# Patient Record
Sex: Female | Born: 1985 | Race: White | Hispanic: No | Marital: Married | Smoking: Never smoker
Health system: Southern US, Community
[De-identification: ages and names within clinical notes are randomized; demographics above are authoritative.]

## PROBLEM LIST (undated history)

## (undated) DIAGNOSIS — K219 Gastro-esophageal reflux disease without esophagitis: Secondary | ICD-10-CM

## (undated) DIAGNOSIS — F909 Attention-deficit hyperactivity disorder, unspecified type: Secondary | ICD-10-CM

## (undated) DIAGNOSIS — K469 Unspecified abdominal hernia without obstruction or gangrene: Secondary | ICD-10-CM

## (undated) DIAGNOSIS — K449 Diaphragmatic hernia without obstruction or gangrene: Secondary | ICD-10-CM

## (undated) HISTORY — DX: Unspecified abdominal hernia without obstruction or gangrene: K46.9

## (undated) HISTORY — PX: ESOPHAGOGASTRODUODENOSCOPY (EGD) WITH PROPOFOL: SHX5813

## (undated) HISTORY — DX: Gastro-esophageal reflux disease without esophagitis: K21.9

---
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
## 2005-08-06 ENCOUNTER — Ambulatory Visit: Payer: Self-pay | Admitting: Internal Medicine

## 2005-08-07 ENCOUNTER — Ambulatory Visit (HOSPITAL_COMMUNITY): Admission: RE | Admit: 2005-08-07 | Discharge: 2005-08-07 | Payer: Self-pay | Admitting: Internal Medicine

## 2008-11-16 ENCOUNTER — Ambulatory Visit (HOSPITAL_COMMUNITY): Admission: RE | Admit: 2008-11-16 | Discharge: 2008-11-16 | Payer: Self-pay | Admitting: Pulmonary Disease

## 2010-10-20 NOTE — Op Note (Signed)
NAME:  Christina Golden, Christina Golden               ACCOUNT NO.:  [id number]   MEDICAL RECORD NO.:  [id number]          PATIENT TYPE:  AMB   LOCATION:  DAY                           FACILITY:  [facility]   PHYSICIAN:  R. Roetta Sessions, M.D. DATE OF BIRTH:  11-23-1985   DATE OF PROCEDURE:  09/15/2004  DATE OF DISCHARGE:                                 OPERATIVE REPORT   PROCEDURE:  Diagnostic esophagogastroduodenoscopy.   INDICATIONS FOR PROCEDURE:  The patient is a 25 year old lady with recent  chest pain and indigestion. Has been taking doxycycline for two months but  stopped taking it about four days ago because she could not remember to take  it. She was seen in the infirmary down at [facility] and was started on  Reglan and Nexium one week ago. Her father called and got her an appointment  reportedly to see [place] today; however, the office is closed. He called me at  the hospital, and after speaking with her, felt she should come to the  hospital and have EGD. This lady had no true odynophagia or dysphagia, and  her chest symptoms have subsided somewhat the past couple of days. Has had  any abdominal pain, nausea, vomiting, melena, or hematochezia. She has not  lost any weight. EGD is now being done to further evaluate her upper GI  tract symptoms. Pill-induced esophageal injury is high on the differential  list. This approach has been discussed with her at length. Please see  documentation in the medical record.   PROCEDURE NOTE:  O2 saturation, blood pressure, pulse, and respirations were  monitored throughout the entire procedure. Conscious sedation with Versed 7  mg IV and Demerol 125 mg IV in divided doses.   INSTRUMENT:  Olympus video chip system.   FINDINGS:  Examination of the tubular esophagus revealed a single linear  erosion, distal one third of the esophagus with intervening normal mucosa  between the distal aspect of the erosion of the EG junction. There are also  some areas of patchy  erythema involving esophageal mucosa. There was no  frank ulcer seen. Mucosa otherwise appeared normal. EG junction easily  traversed.   Stomach:  The gastric cavity was emptied and insufflated well with air.  Thorough examination of the gastric mucosa included retroflexed view of the  proximal stomach and esophagogastric junction demonstrated small hiatal  hernia only. Pylorus patent and easily traversed. Examination of the bulb  and second portion appeared normal.   The patient tolerated the procedure well and was reactive to endoscopy.   IMPRESSION:  Distal esophageal erosion and patchy erythema of the mucosa is  more consistent with pill-induced injury than reflux. Otherwise, normal  esophageal mucosa. Small hiatal hernia. Otherwise stomach. Normal D1 and D2.   RECOMMENDATIONS:  1.  Refrain from taking any more doxycycline.  2.  Continue Nexium 40 mg orally daily for the next month. Will go ahead and      stop Reglan.  3.  I suspect her symptoms will improve dramatically in the next week. If      they do not or if she  has any future problems, she is to let me know.      RMR/MEDQ  D:  09/15/2004  T:  09/15/2004  Job:  [id number]

## 2011-08-06 ENCOUNTER — Encounter (INDEPENDENT_AMBULATORY_CARE_PROVIDER_SITE_OTHER): Payer: Self-pay | Admitting: Internal Medicine

## 2011-08-06 ENCOUNTER — Ambulatory Visit (INDEPENDENT_AMBULATORY_CARE_PROVIDER_SITE_OTHER): Payer: PRIVATE HEALTH INSURANCE | Admitting: Internal Medicine

## 2011-08-06 VITALS — BP 118/70 | HR 78 | Temp 98.7°F | Resp 12 | Ht 66.0 in | Wt 174.8 lb

## 2011-08-06 DIAGNOSIS — R1013 Epigastric pain: Secondary | ICD-10-CM | POA: Insufficient documentation

## 2011-08-06 DIAGNOSIS — K219 Gastro-esophageal reflux disease without esophagitis: Secondary | ICD-10-CM

## 2011-08-06 MED ORDER — PANTOPRAZOLE SODIUM 40 MG PO TBEC: 40.0000 mg | DELAYED_RELEASE_TABLET | Freq: Every day | ORAL | Status: DC

## 2011-08-06 NOTE — Progress Notes (Signed)
Presenting complaint;  epigastric pain and intermittent heartburn. Subjective: Patient is a 26 year old Caucasian female is therefore scheduled visit for evaluation of epigastric pain and heartburn. She initially presented in March 2006 for chest pain and indigestion and underwent EGD by Dr. Jena Gauss [facility]). She had been on doxycycline. She had few esophageal erosions and focal erythema and a small sliding-type hernia. 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 She states she has done well with Nexium until was changed to Prilosec about 4 years ago because of cost reasons. She complains of intermittent epigastric pain of several months duration and she has heartburn at least 3-4 times a week despite taking medication and watching her diet. She describes her pain to be burning or aching which seems to get worse with coffee and eases with rest. She also has noted fullness or tightness along the anterior neck. She also has noted intermittent hoarseness, dry cough and dysphagia to solids occasionally. She has a good appetite. She has gained 10 pounds in the last 18 months. Her bowels move regularly. She denies melena or rectal bleeding. She takes Zantac OTC occasionally. She takes ibuprofen OTC 2-3 times a week for headache or musculoskeletal pain. H. pylori testing in the past was negative. She had negative upper abdominal ultrasound in March 2007.   Current Medications: Current Outpatient Prescriptions  Medication Sig Dispense Refill  . amphetamine-dextroamphetamine (ADDERALL, 10MG ,) 10 MG tablet Take 10 mg by mouth daily.      . fluticasone (FLONASE) 50 MCG/ACT nasal spray Place 2 sprays into the nose 2 (two) times daily.      Marland Kitchen ibuprofen (ADVIL,MOTRIN) 200 MG tablet Take 200 mg by mouth every 8 (eight) hours as needed.      . Multiple Vitamins-Minerals (MULTI COMPLETE PO) Take by mouth.      . norethindrone-ethinyl estradiol (JUNEL FE,GILDESS FE,LOESTRIN FE) 1-20  MG-MCG tablet Take 1 tablet by mouth daily.      Marland Kitchen omeprazole (PRILOSEC) 20 MG capsule Take 20 mg by mouth daily.       Past medical history; GERD as above. ADHD. She's had her wisdom teeth extracted. Allergies to sulfa resulting in rash. Family history; Both parents are in good father had sigmoid colon resection for complicated diverticulitis. She has a brother and a sister in good health. Social history. She is single. She does not smoke cigarettes and drinks alcohol socially but not every day or every week. Should be finishing college with a degree in pharmacy in May next year. Objective: Blood pressure 118/70, pulse 78, temperature 98.7 F (37.1 C), temperature source Oral, resp. rate 12, height 5\' 6"  (1.676 m), weight 174 lb 12.8 oz (79.289 kg), last menstrual period 07/15/2011. Conjunctiva is pink. Sclera is nonicteric Oropharyngeal mucosa is normal. No neck masses or thyromegaly noted. Cardiac exam with regular rhythm normal S1 and S2. No murmur or gallop noted. Lungs are clear to auscultation. Abdomen is symmetrical. Bowel sounds are normal. Palpation reveals soft abdomen with mild midepigastric tenderness but no organomegaly or masses. Rectal examination deferred.  No LE edema or clubbing noted.   Assessment: #1. Chronic GERD. Symptoms not well controlled with omeprazole or Prilosec. She had EGD in March 2006 as above.  #2. Intermittent epigastric pain. She possibly has chronic dyspepsia need to rule out gastritis or peptic ulcer disease.   Plan: Patient advised not to take ibuprofen. She can take Tylenol on when necessary basis for musculoskeletal pain not to exceed 2 g per  day. Hemoccults x1. H. pylori serology. Discontinue omeprazole. Start pantoprazole 40 mg by mouth every morning. If Hemoccult is positive would proceed with esophagogastroduodenoscopy. If she does not respond to pantoprazole will consider hepatobiliary ultrasound and EGD.

## 2011-08-06 NOTE — Patient Instructions (Addendum)
Can take acetaminophen on an as-needed basis no more than 2 g per day. Discontinue omeprazole and start pantoprazole 40 mg by mouth 30 minutes before breakfast daily. Hemoccults x1. H. pylori serology to be done.

## 2011-08-17 ENCOUNTER — Telehealth (INDEPENDENT_AMBULATORY_CARE_PROVIDER_SITE_OTHER): Payer: Self-pay | Admitting: *Deleted

## 2011-08-17 NOTE — Telephone Encounter (Signed)
Pantoprazole was to expensive . Patient tried the 14 days of Aciphex and feel that it is working well for her. Called to [facility] [place] Aciphex 20 mg Take 1 by mouth every morning #30 with 11 refills. Patient made aware.  Also note patient's Hemocult card - Neg for blood - patient was made aware.

## 2011-08-21 NOTE — Telephone Encounter (Signed)
Noted. Can continue AcipHex. Office visit in 3-4 months

## 2011-08-22 NOTE — Telephone Encounter (Signed)
Apt has been scheduled for 12/03/11 at 10:30 with Dr. Karilyn Cota

## 2011-08-22 NOTE — Telephone Encounter (Signed)
Forwarded to Lupita Leash to make an appointment for 3-4 months. Patient may already have one.

## 2011-09-21 ENCOUNTER — Encounter (INDEPENDENT_AMBULATORY_CARE_PROVIDER_SITE_OTHER): Payer: Self-pay

## 2011-12-03 ENCOUNTER — Ambulatory Visit (INDEPENDENT_AMBULATORY_CARE_PROVIDER_SITE_OTHER): Payer: PRIVATE HEALTH INSURANCE | Admitting: Internal Medicine

## 2015-08-12 ENCOUNTER — Emergency Department (HOSPITAL_COMMUNITY)
Admission: EM | Admit: 2015-08-12 | Discharge: 2015-08-12 | Disposition: A | Discharge status: Home / Self Care | Payer: PRIVATE HEALTH INSURANCE | Attending: Emergency Medicine | Admitting: Emergency Medicine

## 2015-08-12 ENCOUNTER — Encounter (HOSPITAL_COMMUNITY): Payer: Self-pay | Admitting: Emergency Medicine

## 2015-08-12 ENCOUNTER — Emergency Department (HOSPITAL_COMMUNITY): Payer: PRIVATE HEALTH INSURANCE

## 2015-08-12 DIAGNOSIS — Y929 Unspecified place or not applicable: Secondary | ICD-10-CM | POA: Diagnosis not present

## 2015-08-12 DIAGNOSIS — S39012A Strain of muscle, fascia and tendon of lower back, initial encounter: Secondary | ICD-10-CM | POA: Diagnosis not present

## 2015-08-12 DIAGNOSIS — S161XXA Strain of muscle, fascia and tendon at neck level, initial encounter: Secondary | ICD-10-CM | POA: Diagnosis not present

## 2015-08-12 DIAGNOSIS — S199XXA Unspecified injury of neck, initial encounter: Secondary | ICD-10-CM | POA: Diagnosis present

## 2015-08-12 DIAGNOSIS — Y939 Activity, unspecified: Secondary | ICD-10-CM | POA: Insufficient documentation

## 2015-08-12 DIAGNOSIS — Y999 Unspecified external cause status: Secondary | ICD-10-CM | POA: Insufficient documentation

## 2015-08-12 LAB — POC URINE PREG, ED: Preg Test, Ur: NEGATIVE

## 2015-08-12 MED ORDER — METHOCARBAMOL 500 MG PO TABS: 500.0000 mg | ORAL_TABLET | Freq: Four times a day (QID) | ORAL | Status: AC

## 2015-08-12 MED ORDER — IBUPROFEN 600 MG PO TABS: 600.0000 mg | ORAL_TABLET | Freq: Four times a day (QID) | ORAL | Status: DC | PRN

## 2015-08-12 MED ORDER — IBUPROFEN 800 MG PO TABS
800.0000 mg | ORAL_TABLET | Freq: Once | ORAL | Status: AC
  Administered 2015-08-12: 800 mg via ORAL
  Filled 2015-08-12: qty 1
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
## 2018-04-01 DIAGNOSIS — L821 Other seborrheic keratosis: Secondary | ICD-10-CM | POA: Diagnosis not present

## 2018-04-01 DIAGNOSIS — B078 Other viral warts: Secondary | ICD-10-CM | POA: Diagnosis not present

## 2018-04-01 DIAGNOSIS — L818 Other specified disorders of pigmentation: Secondary | ICD-10-CM | POA: Diagnosis not present

## 2018-04-01 DIAGNOSIS — Z1283 Encounter for screening for malignant neoplasm of skin: Secondary | ICD-10-CM | POA: Diagnosis not present

## 2018-04-01 DIAGNOSIS — D225 Melanocytic nevi of trunk: Secondary | ICD-10-CM | POA: Diagnosis not present

## 2018-05-13 DIAGNOSIS — H52223 Regular astigmatism, bilateral: Secondary | ICD-10-CM | POA: Diagnosis not present

## 2018-05-13 DIAGNOSIS — H5213 Myopia, bilateral: Secondary | ICD-10-CM | POA: Diagnosis not present

## 2018-07-21 DIAGNOSIS — Z6828 Body mass index (BMI) 28.0-28.9, adult: Secondary | ICD-10-CM | POA: Diagnosis not present

## 2018-07-21 DIAGNOSIS — Z01419 Encounter for gynecological examination (general) (routine) without abnormal findings: Secondary | ICD-10-CM | POA: Diagnosis not present

## 2018-11-29 DIAGNOSIS — Z20828 Contact with and (suspected) exposure to other viral communicable diseases: Secondary | ICD-10-CM | POA: Diagnosis not present

## 2018-12-19 DIAGNOSIS — Z3200 Encounter for pregnancy test, result unknown: Secondary | ICD-10-CM | POA: Diagnosis not present

## 2019-01-19 DIAGNOSIS — Z3201 Encounter for pregnancy test, result positive: Secondary | ICD-10-CM | POA: Diagnosis not present

## 2019-01-23 DIAGNOSIS — Z3401 Encounter for supervision of normal first pregnancy, first trimester: Secondary | ICD-10-CM | POA: Diagnosis not present

## 2019-01-23 DIAGNOSIS — Z3689 Encounter for other specified antenatal screening: Secondary | ICD-10-CM | POA: Diagnosis not present

## 2019-01-30 DIAGNOSIS — Z3401 Encounter for supervision of normal first pregnancy, first trimester: Secondary | ICD-10-CM | POA: Diagnosis not present

## 2019-01-30 DIAGNOSIS — Z3689 Encounter for other specified antenatal screening: Secondary | ICD-10-CM | POA: Diagnosis not present

## 2019-01-30 DIAGNOSIS — Z3400 Encounter for supervision of normal first pregnancy, unspecified trimester: Secondary | ICD-10-CM | POA: Diagnosis not present

## 2019-02-10 ENCOUNTER — Other Ambulatory Visit: Payer: Self-pay

## 2019-02-10 ENCOUNTER — Ambulatory Visit
Admission: EM | Admit: 2019-02-10 | Discharge: 2019-02-10 | Disposition: A | Discharge status: Home / Self Care | Payer: 59 | Attending: Emergency Medicine | Admitting: Emergency Medicine

## 2019-02-10 DIAGNOSIS — K0889 Other specified disorders of teeth and supporting structures: Secondary | ICD-10-CM

## 2019-02-10 DIAGNOSIS — K029 Dental caries, unspecified: Secondary | ICD-10-CM

## 2019-02-10 MED ORDER — AMOXICILLIN-POT CLAVULANATE 875-125 MG PO TABS: 1.0000 | ORAL_TABLET | Freq: Two times a day (BID) | ORAL | 0 refills | Status: AC

## 2019-02-10 NOTE — Discharge Instructions (Signed)
Augmentin prescribed.  Take as directed and to completion Take OTC tylenol as needed for pain Avoid chewing on left side Stick with soft diet Follow up with dentist for scheduled appt for further evaluation and treatment  Return or go to the ED if you have any new or worsening symptoms such as fever, chills, difficulty swallowing, painful swallowing, oral or neck swelling, nausea, vomiting, chest pain, SOB, etc..Marland Kitchen

## 2019-02-10 NOTE — ED Provider Notes (Signed)
[place]   DO:[id number] 02/10/19 Arrival Time: [id number]  CC: DENTAL PAIN  SUBJECTIVE:  Christina Golden is a 33 y.o. female hx significant for [redacted] weeks pregnant, who presents with left sided ear pain and left sided dental pain x couple of day.  Denies a precipitating event or trauma.  Localizes pain to left lower gums.  Has tried OTC analgesics without relief.  Worse with chewing.  Has dentist appt on 9/28.  Denies similar symptoms in the past.  Denies fever, chills, congestion, rhinorrhea, cough, dysphagia, odynophagia, oral or neck swelling, nausea, vomiting, chest pain, SOB.    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  All other pertinent ROS negative.     Past Medical History:  Diagnosis Date  . GERD (gastroesophageal reflux disease)   . Hernia of unspecified site of abdominal cavity without mention of obstruction or gangrene    Hiatal   Past Surgical History:  Procedure Laterality Date  . UPPER GASTROINTESTINAL ENDOSCOPY  2006   Dr. Gala Golden  . WISDOM TOOTH EXTRACTION  2004   Allergies  Allergen Reactions  . Sulfur Rash   No current facility-administered medications on file prior to encounter.    Current Outpatient Medications on File Prior to Encounter  Medication Sig Dispense Refill  . fluticasone (FLONASE) 50 MCG/ACT nasal spray Place 2 sprays into the nose 2 (two) times daily.    Marland Kitchen ibuprofen (ADVIL,MOTRIN) 600 MG tablet Take 1 tablet (600 mg total) by mouth every 6 (six) hours as needed. 30 tablet 0  . Multiple Vitamins-Minerals (MULTI COMPLETE PO) Take by mouth.    . pantoprazole (PROTONIX) 40 MG tablet Take 1 tablet (40 mg total) by mouth daily before breakfast. 30 tablet 5  . [DISCONTINUED] amphetamine-dextroamphetamine (ADDERALL, 10MG ,) 10 MG tablet Take 10 mg by mouth daily.    . [DISCONTINUED] norethindrone-ethinyl estradiol (JUNEL FE,GILDESS FE,LOESTRIN FE) 1-20 MG-MCG tablet Take 1 tablet by mouth daily.     Social History    Socioeconomic History  . Marital status: Single    Spouse name: Not on file  . Number of children: Not on file  . Years of education: Not on file  . Highest education level: Not on file  Occupational History  . Not on file  Social Needs  . Financial resource strain: Not on file  . Food insecurity    Worry: Not on file    Inability: Not on file  . Transportation needs    Medical: Not on file    Non-medical: Not on file  Tobacco Use  . Smoking status: Never Smoker  . Smokeless tobacco: Never Used  Substance and Sexual Activity  . Alcohol use: Yes    Comment: Social  . Drug use: No  . Sexual activity: Not on file  Lifestyle  . Physical activity    Days per week: Not on file    Minutes per session: Not on file  . Stress: Not on file  Relationships  . Social Herbalist on phone: Not on file    Gets together: Not on file    Attends religious service: Not on file    Active member of club or organization: Not on file    Attends meetings of clubs or organizations: Not on file    Relationship status: Not on file  . Intimate partner violence    Fear of current or ex partner: Not on file    Emotionally abused: Not on file  Physically abused: Not on file    Forced sexual activity: Not on file  Other Topics Concern  . Not on file  Social History Narrative  . Not on file   Family History  Problem Relation Age of Onset  . Hypertension Mother   . Hypertension Father   . Hyperlipidemia Father   . Healthy Sister   . Healthy Brother     OBJECTIVE:  Vitals:   02/10/19 1706  BP: 124/87  Pulse: 81  Resp: 18  Temp: 98.2 F (36.8 C)  SpO2: 98%    General appearance: alert; no distress HENT: normocephalic; atraumatic; EACs clear, TMs pearly gray; nares patent without rhinorrhea; oropharynx clear dentition: good; dental caries, second back molar over left lower gums without areas of fluctuance Neck: supple without LAD; mildly ttp over pre- and post- auricular  lymph node chains Lungs: normal respirations; CTAB CV: RRR Skin: warm and dry Psychological: alert and cooperative; normal mood and affect; pleasant   ASSESSMENT & PLAN:  1. Pain, dental   2. Dental caries     Meds ordered this encounter  Medications  . amoxicillin-clavulanate (AUGMENTIN) 875-125 MG tablet    Sig: Take 1 tablet by mouth every 12 (twelve) hours for 10 days.    Dispense:  20 tablet    Refill:  0    Order Specific Question:   Supervising Provider    Answer:   Christina Golden [id number]   Augmentin prescribed.  Take as directed and to completion Take OTC tylenol as needed for pain Avoid chewing on left side Stick with soft diet Follow up with dentist for scheduled appt for further evaluation and treatment  Return or go to the ED if you have any new or worsening symptoms such as fever, chills, difficulty swallowing, painful swallowing, oral or neck swelling, nausea, vomiting, chest pain, SOB, etc...  Reviewed expectations re: course of current medical issues. Questions answered. Outlined signs and symptoms indicating need for more acute intervention. Patient verbalized understanding. After Visit Summary given.   Lestine Box, PA-C 02/10/19 1737

## 2019-02-10 NOTE — ED Triage Notes (Signed)
Pt has c/o left ear pain for past few days

## 2019-02-19 DIAGNOSIS — Z3400 Encounter for supervision of normal first pregnancy, unspecified trimester: Secondary | ICD-10-CM | POA: Diagnosis not present

## 2019-02-19 DIAGNOSIS — O021 Missed abortion: Secondary | ICD-10-CM | POA: Diagnosis not present

## 2019-02-20 ENCOUNTER — Other Ambulatory Visit: Payer: Self-pay | Admitting: Obstetrics and Gynecology

## 2019-02-20 ENCOUNTER — Other Ambulatory Visit (HOSPITAL_COMMUNITY): Payer: Self-pay | Admitting: Obstetrics and Gynecology

## 2019-02-20 DIAGNOSIS — Z7689 Persons encountering health services in other specified circumstances: Secondary | ICD-10-CM

## 2019-02-23 ENCOUNTER — Other Ambulatory Visit (HOSPITAL_COMMUNITY)
Admission: RE | Admit: 2019-02-23 | Discharge: 2019-02-23 | Disposition: A | Discharge status: Home / Self Care | Payer: 59 | Source: Ambulatory Visit | Attending: Obstetrics and Gynecology | Admitting: Obstetrics and Gynecology

## 2019-02-23 ENCOUNTER — Other Ambulatory Visit: Payer: Self-pay | Admitting: Obstetrics and Gynecology

## 2019-02-23 DIAGNOSIS — Z01812 Encounter for preprocedural laboratory examination: Secondary | ICD-10-CM | POA: Insufficient documentation

## 2019-02-23 DIAGNOSIS — Z20828 Contact with and (suspected) exposure to other viral communicable diseases: Secondary | ICD-10-CM | POA: Insufficient documentation

## 2019-02-23 DIAGNOSIS — Z419 Encounter for procedure for purposes other than remedying health state, unspecified: Secondary | ICD-10-CM

## 2019-02-24 ENCOUNTER — Other Ambulatory Visit: Payer: Self-pay

## 2019-02-24 ENCOUNTER — Encounter (HOSPITAL_BASED_OUTPATIENT_CLINIC_OR_DEPARTMENT_OTHER): Payer: Self-pay | Admitting: *Deleted

## 2019-02-24 ENCOUNTER — Other Ambulatory Visit (HOSPITAL_COMMUNITY): Payer: Self-pay | Admitting: Obstetrics and Gynecology

## 2019-02-24 DIAGNOSIS — Z7689 Persons encountering health services in other specified circumstances: Secondary | ICD-10-CM

## 2019-02-24 NOTE — Progress Notes (Addendum)
Spoke w/ pt via phone for pre-op iinterview.  Npo after mn.  Arrive at [facility].  Needs cbc. Pt had covid done 02-23-2019.  Called and spoke w/ shanelle, or scheduler for dr Ronita Hipps, inquired about pt's ABO/Rh status, if they have one.  [place] stated yes and will fax result.  ADDENDUM:  Received ABO/ Rh result via fax, placed in chart. (B negative)

## 2019-02-25 LAB — NOVEL CORONAVIRUS, NAA (HOSP ORDER, SEND-OUT TO REF LAB; TAT 18-24 HRS): SARS-CoV-2, NAA: NOT DETECTED

## 2019-02-26 ENCOUNTER — Ambulatory Visit (HOSPITAL_BASED_OUTPATIENT_CLINIC_OR_DEPARTMENT_OTHER): Payer: 59 | Admitting: Anesthesiology

## 2019-02-26 ENCOUNTER — Ambulatory Visit (HOSPITAL_COMMUNITY)
Admission: RE | Admit: 2019-02-26 | Discharge: 2019-02-26 | Disposition: A | Discharge status: Home / Self Care | Payer: 59 | Source: Ambulatory Visit | Attending: Obstetrics and Gynecology | Admitting: Obstetrics and Gynecology

## 2019-02-26 ENCOUNTER — Encounter (HOSPITAL_BASED_OUTPATIENT_CLINIC_OR_DEPARTMENT_OTHER)
Admission: RE | Disposition: A | Discharge status: Home / Self Care | Payer: Self-pay | Source: Home / Self Care | Attending: Obstetrics and Gynecology

## 2019-02-26 ENCOUNTER — Encounter (HOSPITAL_BASED_OUTPATIENT_CLINIC_OR_DEPARTMENT_OTHER): Payer: Self-pay

## 2019-02-26 ENCOUNTER — Ambulatory Visit (HOSPITAL_BASED_OUTPATIENT_CLINIC_OR_DEPARTMENT_OTHER)
Admission: RE | Admit: 2019-02-26 | Discharge: 2019-02-26 | Disposition: A | Discharge status: Home / Self Care | Payer: 59 | Attending: Obstetrics and Gynecology | Admitting: Obstetrics and Gynecology

## 2019-02-26 DIAGNOSIS — O364XX0 Maternal care for intrauterine death, not applicable or unspecified: Secondary | ICD-10-CM | POA: Diagnosis not present

## 2019-02-26 DIAGNOSIS — K219 Gastro-esophageal reflux disease without esophagitis: Secondary | ICD-10-CM | POA: Diagnosis not present

## 2019-02-26 DIAGNOSIS — Z7689 Persons encountering health services in other specified circumstances: Secondary | ICD-10-CM

## 2019-02-26 DIAGNOSIS — F909 Attention-deficit hyperactivity disorder, unspecified type: Secondary | ICD-10-CM | POA: Diagnosis not present

## 2019-02-26 DIAGNOSIS — Z3A14 14 weeks gestation of pregnancy: Secondary | ICD-10-CM | POA: Insufficient documentation

## 2019-02-26 DIAGNOSIS — O99342 Other mental disorders complicating pregnancy, second trimester: Secondary | ICD-10-CM | POA: Insufficient documentation

## 2019-02-26 DIAGNOSIS — Z882 Allergy status to sulfonamides status: Secondary | ICD-10-CM | POA: Insufficient documentation

## 2019-02-26 DIAGNOSIS — O99612 Diseases of the digestive system complicating pregnancy, second trimester: Secondary | ICD-10-CM | POA: Diagnosis not present

## 2019-02-26 DIAGNOSIS — Z79899 Other long term (current) drug therapy: Secondary | ICD-10-CM | POA: Insufficient documentation

## 2019-02-26 DIAGNOSIS — O021 Missed abortion: Secondary | ICD-10-CM | POA: Diagnosis not present

## 2019-02-26 HISTORY — PX: OPERATIVE ULTRASOUND: SHX5996

## 2019-02-26 HISTORY — DX: Diaphragmatic hernia without obstruction or gangrene: K44.9

## 2019-02-26 HISTORY — PX: DILATION AND EVACUATION: SHX1459

## 2019-02-26 HISTORY — DX: Attention-deficit hyperactivity disorder, unspecified type: F90.9

## 2019-02-26 LAB — CBC
HCT: 43.9 % (ref 36.0–46.0)
Hemoglobin: 14 g/dL (ref 12.0–15.0)
MCH: 28.9 pg (ref 26.0–34.0)
MCHC: 31.9 g/dL (ref 30.0–36.0)
MCV: 90.5 fL (ref 80.0–100.0)
Platelets: 289 10*3/uL (ref 150–400)
RBC: 4.85 MIL/uL (ref 3.87–5.11)
RDW: 12 % (ref 11.5–15.5)
WBC: 7 10*3/uL (ref 4.0–10.5)
nRBC: 0 % (ref 0.0–0.2)

## 2019-02-26 SURGERY — DILATION AND EVACUATION, UTERUS
Anesthesia: General | Site: Vagina

## 2019-02-26 MED ORDER — LIDOCAINE 2% (20 MG/ML) 5 ML SYRINGE
INTRAMUSCULAR | Status: AC
  Filled 2019-02-26: qty 5

## 2019-02-26 MED ORDER — ONDANSETRON HCL 4 MG/2ML IJ SOLN
INTRAMUSCULAR | Status: DC | PRN
  Administered 2019-02-26: 4 mg via INTRAVENOUS

## 2019-02-26 MED ORDER — SUCCINYLCHOLINE CHLORIDE 200 MG/10ML IV SOSY
PREFILLED_SYRINGE | INTRAVENOUS | Status: DC | PRN
  Administered 2019-02-26: 20 mg via INTRAVENOUS
  Administered 2019-02-26: 80 mg via INTRAVENOUS

## 2019-02-26 MED ORDER — RHO D IMMUNE GLOBULIN 1500 UNIT/2ML IJ SOSY
300.0000 ug | PREFILLED_SYRINGE | Freq: Once | INTRAMUSCULAR | Status: AC
  Administered 2019-02-26: 300 ug via INTRAVENOUS
  Filled 2019-02-26: qty 2

## 2019-02-26 MED ORDER — LACTATED RINGERS IV SOLN
INTRAVENOUS | Status: DC
  Administered 2019-02-26: 08:00:00 via INTRAVENOUS
  Filled 2019-02-26: qty 1000

## 2019-02-26 MED ORDER — MIDAZOLAM HCL 2 MG/2ML IJ SOLN
INTRAMUSCULAR | Status: AC
  Filled 2019-02-26: qty 2

## 2019-02-26 MED ORDER — ACETAMINOPHEN 500 MG PO TABS
ORAL_TABLET | ORAL | Status: AC
  Filled 2019-02-26: qty 2

## 2019-02-26 MED ORDER — FENTANYL CITRATE (PF) 100 MCG/2ML IJ SOLN
INTRAMUSCULAR | Status: DC | PRN
  Administered 2019-02-26: 25 ug via INTRAVENOUS
  Administered 2019-02-26: 75 ug via INTRAVENOUS

## 2019-02-26 MED ORDER — SODIUM CHLORIDE 0.9 % IV SOLN
INTRAVENOUS | Status: DC
  Filled 2019-02-26: qty 1000

## 2019-02-26 MED ORDER — SUCCINYLCHOLINE CHLORIDE 200 MG/10ML IV SOSY
PREFILLED_SYRINGE | INTRAVENOUS | Status: AC
  Filled 2019-02-26: qty 10

## 2019-02-26 MED ORDER — KETOROLAC TROMETHAMINE 30 MG/ML IJ SOLN
INTRAMUSCULAR | Status: AC
  Filled 2019-02-26: qty 1

## 2019-02-26 MED ORDER — FENTANYL CITRATE (PF) 100 MCG/2ML IJ SOLN
INTRAMUSCULAR | Status: AC
  Filled 2019-02-26: qty 2

## 2019-02-26 MED ORDER — KETOROLAC TROMETHAMINE 30 MG/ML IJ SOLN
INTRAMUSCULAR | Status: DC | PRN
  Administered 2019-02-26: 30 mg via INTRAVENOUS

## 2019-02-26 MED ORDER — PROPOFOL 10 MG/ML IV BOLUS
INTRAVENOUS | Status: AC
  Filled 2019-02-26: qty 40

## 2019-02-26 MED ORDER — PROPOFOL 10 MG/ML IV BOLUS
INTRAVENOUS | Status: DC | PRN
  Administered 2019-02-26: 180 mg via INTRAVENOUS

## 2019-02-26 MED ORDER — ONDANSETRON HCL 4 MG/2ML IJ SOLN
INTRAMUSCULAR | Status: AC
  Filled 2019-02-26: qty 2

## 2019-02-26 MED ORDER — BUPIVACAINE HCL 0.25 % IJ SOLN
INTRAMUSCULAR | Status: DC | PRN
  Administered 2019-02-26: 13 mL

## 2019-02-26 MED ORDER — TRAMADOL HCL 50 MG PO TABS
50.0000 mg | ORAL_TABLET | Freq: Four times a day (QID) | ORAL | Status: DC | PRN
  Administered 2019-02-26: 50 mg via ORAL
  Filled 2019-02-26: qty 1

## 2019-02-26 MED ORDER — METHYLERGONOVINE MALEATE 0.2 MG/ML IJ SOLN
INTRAMUSCULAR | Status: AC
  Filled 2019-02-26: qty 1

## 2019-02-26 MED ORDER — DEXAMETHASONE SODIUM PHOSPHATE 10 MG/ML IJ SOLN
INTRAMUSCULAR | Status: DC | PRN
  Administered 2019-02-26: 10 mg via INTRAVENOUS

## 2019-02-26 MED ORDER — CEFAZOLIN SODIUM-DEXTROSE 2-4 GM/100ML-% IV SOLN
INTRAVENOUS | Status: AC
  Filled 2019-02-26: qty 100

## 2019-02-26 MED ORDER — FENTANYL CITRATE (PF) 100 MCG/2ML IJ SOLN
25.0000 ug | INTRAMUSCULAR | Status: DC | PRN
  Administered 2019-02-26: 25 ug via INTRAVENOUS
  Filled 2019-02-26: qty 1

## 2019-02-26 MED ORDER — ACETAMINOPHEN 500 MG PO TABS
1000.0000 mg | ORAL_TABLET | Freq: Once | ORAL | Status: AC
  Administered 2019-02-26: 1000 mg via ORAL
  Filled 2019-02-26: qty 2

## 2019-02-26 MED ORDER — MIDAZOLAM HCL 5 MG/5ML IJ SOLN
INTRAMUSCULAR | Status: DC | PRN
  Administered 2019-02-26: 2 mg via INTRAVENOUS

## 2019-02-26 MED ORDER — LIDOCAINE 2% (20 MG/ML) 5 ML SYRINGE
INTRAMUSCULAR | Status: DC | PRN
  Administered 2019-02-26: 100 mg via INTRAVENOUS

## 2019-02-26 MED ORDER — TRAMADOL HCL 50 MG PO TABS
ORAL_TABLET | ORAL | Status: AC
  Filled 2019-02-26: qty 1

## 2019-02-26 MED ORDER — DEXAMETHASONE SODIUM PHOSPHATE 10 MG/ML IJ SOLN
INTRAMUSCULAR | Status: AC
  Filled 2019-02-26: qty 1

## 2019-02-26 MED ORDER — CEFAZOLIN SODIUM-DEXTROSE 2-4 GM/100ML-% IV SOLN
2.0000 g | INTRAVENOUS | Status: AC
  Administered 2019-02-26: 2 g via INTRAVENOUS
  Filled 2019-02-26: qty 100

## 2019-02-26 MED ORDER — TRAMADOL HCL 50 MG PO TABS: 50.0000 mg | ORAL_TABLET | Freq: Four times a day (QID) | ORAL | 0 refills | Status: DC | PRN

## 2019-02-26 SURGICAL SUPPLY — 26 items
BAG URINE DRAINAGE (UROLOGICAL SUPPLIES) IMPLANT
CATH FOLEY 2WAY SLVR 30CC 16FR (CATHETERS) IMPLANT
CATH ROBINSON RED A/P 16FR (CATHETERS) ×1 IMPLANT
COVER WAND RF STERILE (DRAPES) ×3 IMPLANT
FILTER UTR ASPR ASSEMBLY (MISCELLANEOUS) ×3 IMPLANT
GLOVE BIO SURGEON STRL SZ7.5 (GLOVE) ×3 IMPLANT
GOWN STRL REUS W/TWL XL LVL3 (GOWN DISPOSABLE) ×3 IMPLANT
HOSE CONNECTING 18IN BERKELEY (TUBING) ×3 IMPLANT
KIT BERKELEY 1ST TRIMESTER 3/8 (MISCELLANEOUS) ×6 IMPLANT
KIT TURNOVER CYSTO (KITS) ×3 IMPLANT
NS IRRIG 500ML POUR BTL (IV SOLUTION) IMPLANT
PACK VAGINAL MINOR WOMEN LF (CUSTOM PROCEDURE TRAY) ×3 IMPLANT
PAD OB MATERNITY 4.3X12.25 (PERSONAL CARE ITEMS) ×1 IMPLANT
PAD PREP 24X48 CUFFED NSTRL (MISCELLANEOUS) ×3 IMPLANT
SCOPETTES 8  STERILE (MISCELLANEOUS)
SCOPETTES 8 STERILE (MISCELLANEOUS) IMPLANT
SET BERKELEY SUCTION TUBING (SUCTIONS) ×3 IMPLANT
TOWEL OR 17X26 10 PK STRL BLUE (TOWEL DISPOSABLE) ×3 IMPLANT
TRAP TISSUE FILTER (MISCELLANEOUS) ×6 IMPLANT
TUBE CONNECTING 12X1/4 (SUCTIONS) IMPLANT
VACURETTE 10 RIGID CVD (CANNULA) IMPLANT
VACURETTE 12 RIGID CVD (CANNULA) ×1 IMPLANT
VACURETTE 7MM CVD STRL WRAP (CANNULA) IMPLANT
VACURETTE 8 RIGID CVD (CANNULA) IMPLANT
VACURETTE 9 RIGID CVD (CANNULA) IMPLANT
WATER STERILE IRR 500ML POUR (IV SOLUTION) ×3 IMPLANT

## 2019-02-26 NOTE — Anesthesia Preprocedure Evaluation (Addendum)
Anesthesia Evaluation  Patient identified by MRN, date of birth, ID band Patient awake    Reviewed: Allergy & Precautions, NPO status , Patient's Chart, lab work & pertinent test results  Airway Mallampati: II  TM Distance: >3 FB Neck ROM: Full    Dental no notable dental hx.    Pulmonary neg pulmonary ROS,    Pulmonary exam normal breath sounds clear to auscultation       Cardiovascular negative cardio ROS Normal cardiovascular exam Rhythm:Regular Rate:Normal     Neuro/Psych negative neurological ROS  negative psych ROS   GI/Hepatic Neg liver ROS, hiatal hernia, GERD  ,  Endo/Other  negative endocrine ROS  Renal/GU negative Renal ROS  negative genitourinary   Musculoskeletal negative musculoskeletal ROS (+)   Abdominal   Peds  (+) ADHD Hematology negative hematology ROS (+)   Anesthesia Other Findings 13 week fetal demise  Reproductive/Obstetrics                            Anesthesia Physical Anesthesia Plan  ASA: II  Anesthesia Plan: General   Post-op Pain Management:    Induction: Intravenous and Rapid sequence  PONV Risk Score and Plan: 3 and Midazolam, Dexamethasone, Ondansetron and Scopolamine patch - Pre-op  Airway Management Planned: Oral ETT  Additional Equipment:   Intra-op Plan:   Post-operative Plan: Extubation in OR  Informed Consent: I have reviewed the patients History and Physical, chart, labs and discussed the procedure including the risks, benefits and alternatives for the proposed anesthesia with the patient or authorized representative who has indicated his/her understanding and acceptance.     Dental advisory given  Plan Discussed with: CRNA  Anesthesia Plan Comments:         Anesthesia Quick Evaluation

## 2019-02-26 NOTE — Op Note (Signed)
NAMEDAYTONA, Christina Golden MEDICAL RECORD [id number] ACCOUNT [id number] DATE OF BIRTH:02-25-1986 FACILITY: [facility] LOCATION: [facility] PHYSICIAN:Tamesha Ellerbrock J. Cassidy Tabet, MD  OPERATIVE REPORT  DATE OF PROCEDURE:  02/26/2019  PREOPERATIVE DIAGNOSIS:  Second trimester fetal loss at 14 weeks.  POSTOPERATIVE DIAGNOSIS:  Second trimester fetal loss at 14 weeks.  PROCEDURE:  Suction dilation and evacuation under ultrasound guidance.  SURGEON:  Brien Few, MD  ASSISTANT:  None.  ANESTHESIA:  General.  ESTIMATED BLOOD LOSS:  50 mL.  COMPLICATIONS:  None.  DRAINS:  None.  COUNTS:  Correct.  DISPOSITION:  The patient was taken to recovery in good condition.  BRIEF OPERATIVE NOTE:  After being apprised of the risks of anesthesia, infection, bleeding, and surrounding organs, possible need for repair, delayed versus immediate complications including bowel, bladder injury, possible need for repair.  The patient  was brought to the operating room.  She was administered general anesthetic without complications.  Prepped and draped in usual sterile fashion, catheterized until the bladder was empty.  Exam under anesthesia reveals a 12- to 14-week size anteflexed  uterus.  Ultrasound performed revealing as noted fetal demise with no fetal heart tones noted.  Cervix was then easily dilated up to a #37 Pratt dilator after placement of a dilute Marcaine solution 20 mL paracervical block in standard fashion.  At this  time, under ultrasound guidance, suction was performed, extracting the products of conception without difficulty.  Blunt curettage and repeat suction confirmed the cavity to be empty.  Good hemostasis was noted.  All instruments were removed.  The  patient tolerated the procedure well.  Tissue was sent for chromosomes and permanent pathology.  The patient was awakened and transferred to recovery in good condition.  LN/NUANCE  D:02/26/2019 T:02/26/2019 JOB:[id number]

## 2019-02-26 NOTE — Op Note (Signed)
02/26/2019  10:15 AM  PATIENT:  Christina Golden  33 y.o. female  PRE-OPERATIVE DIAGNOSIS:  13wk Fetal Demise  POST-OPERATIVE DIAGNOSIS:  13wk Fetal Demise  PROCEDURE:  Procedure(s): DILATATION AND EVACUATION OPERATIVE ULTRASOUND  SURGEON:  Surgeon(s): Brien Few, MD  ASSISTANTS: none   ANESTHESIA:   general  ESTIMATED BLOOD LOSS: 200 mL   DRAINS: none   LOCAL MEDICATIONS USED:  MARCAINE    and Amount: 20 ml  SPECIMEN:  Source of Specimen:  POC  DISPOSITION OF SPECIMEN:  PATHOLOGY  COUNTS:  YES  DICTATION #[id number]  PLAN OF CARE: dc home  PATIENT DISPOSITION:  PACU - hemodynamically stable.

## 2019-02-26 NOTE — H&P (Signed)
Christina Golden is an 33 y.o. female. MAB at 13 wks for D&E.  Pertinent Gynecological History: Menses: flow is moderate Bleeding: na Contraception: none DES exposure: denies Blood transfusions: none Sexually transmitted diseases: no past history Previous GYN Procedures: na  Last mammogram: na Date: na Last pap: normal Date: 2020 OB History: G1, P0   Menstrual History: Menarche age: 36 Patient's last menstrual period was 11/24/2018 (approximate).    Past Medical History:  Diagnosis Date  . ADHD   . GERD (gastroesophageal reflux disease)   . Hiatal hernia     Past Surgical History:  Procedure Laterality Date  . ESOPHAGOGASTRODUODENOSCOPY (EGD) WITH PROPOFOL  09-15-2004  dr Gala Romney  . WISDOM TOOTH EXTRACTION  2004    Family History  Problem Relation Age of Onset  . Hypertension Mother   . Hypertension Father   . Hyperlipidemia Father   . Healthy Sister   . Healthy Brother     Social History:  reports that she has never smoked. She has never used smokeless tobacco. She reports previous alcohol use. She reports that she does not use drugs.  Allergies:  Allergies  Allergen Reactions  . Sulfa Antibiotics Rash    Medications Prior to Admission  Medication Sig Dispense Refill Last Dose  . amphetamine-dextroamphetamine (ADDERALL) 30 MG tablet Take 30 mg by mouth daily.   02/24/2019  . famotidine (PEPCID) 20 MG tablet Take 20 mg by mouth daily.   02/24/2019  . Prenatal Vit-Fe Fumarate-FA (MULTIVITAMIN-PRENATAL) 27-0.8 MG TABS tablet Take 1 tablet by mouth daily at 12 noon.   02/18/2019    Review of Systems  Constitutional: Negative.   All other systems reviewed and are negative.   Blood pressure 131/79, pulse 86, temperature 97.9 F (36.6 C), temperature source Oral, resp. rate 16, height 5\' 8"  (1.727 m), weight 78.7 kg, last menstrual period 11/24/2018, SpO2 100 %, not currently breastfeeding. Physical Exam  Nursing note and vitals reviewed. Constitutional: She is  oriented to person, place, and time. She appears well-developed and well-nourished.  HENT:  Head: Normocephalic and atraumatic.  Neck: Normal range of motion. Neck supple.  Cardiovascular: Normal rate and regular rhythm.  Respiratory: Effort normal and breath sounds normal.  GI: Soft. Bowel sounds are normal.  Genitourinary:    Vagina and uterus normal.   Musculoskeletal: Normal range of motion.  Neurological: She is alert and oriented to person, place, and time. She has normal reflexes.  Skin: Skin is warm and dry.  Psychiatric: She has a normal mood and affect.    Results for orders placed or performed during the hospital encounter of 02/26/19 (from the past 24 hour(s))  CBC     Status: None   Collection Time: 02/26/19  8:23 AM  Result Value Ref Range   WBC 7.0 4.0 - 10.5 K/uL   RBC 4.85 3.87 - 5.11 MIL/uL   Hemoglobin 14.0 12.0 - 15.0 g/dL   HCT 43.9 36.0 - 46.0 %   MCV 90.5 80.0 - 100.0 fL   MCH 28.9 26.0 - 34.0 pg   MCHC 31.9 30.0 - 36.0 g/dL   RDW 12.0 11.5 - 15.5 %   Platelets 289 150 - 400 K/uL   nRBC 0.0 0.0 - 0.2 %    No results found.  Assessment/Plan: 13 week MAB Second trimester D&E  Sono guidance Tissue for chromosomes Consent done  Jehan Ranganathan J 02/26/2019, 9:35 AM

## 2019-02-26 NOTE — Discharge Instructions (Signed)
° °  Do not take any nonsteroidal anti inflammatories until after 4:00 pm today.   Post Anesthesia Home Care Instructions  Activity: Get plenty of rest for the remainder of the day. A responsible individual must stay with you for 24 hours following the procedure.  For the next 24 hours, DO NOT: -Drive a car -Paediatric nurse -Drink alcoholic beverages -Take any medication unless instructed by your physician -Make any legal decisions or sign important papers.  Meals: Start with liquid foods such as gelatin or soup. Progress to regular foods as tolerated. Avoid greasy, spicy, heavy foods. If nausea and/or vomiting occur, drink only clear liquids until the nausea and/or vomiting subsides. Call your physician if vomiting continues.  Special Instructions/Symptoms: Your throat may feel dry or sore from the anesthesia or the breathing tube placed in your throat during surgery. If this causes discomfort, gargle with warm salt water. The discomfort should disappear within 24 hours.  If you had a scopolamine patch placed behind your ear for the management of post- operative nausea and/or vomiting:  1. The medication in the patch is effective for 72 hours, after which it should be removed.  Wrap patch in a tissue and discard in the trash. Wash hands thoroughly with soap and water. 2. You may remove the patch earlier than 72 hours if you experience unpleasant side effects which may include dry mouth, dizziness or visual disturbances. 3. Avoid touching the patch. Wash your hands with soap and water after contact with the patch.

## 2019-02-26 NOTE — Anesthesia Postprocedure Evaluation (Signed)
Anesthesia Post Note  Patient: Christina Golden  Procedure(s) Performed: DILATATION AND EVACUATION (N/A Vagina ) OPERATIVE ULTRASOUND (N/A Abdomen)     Patient location during evaluation: PACU Anesthesia Type: General Level of consciousness: awake and alert Pain management: pain level controlled Vital Signs Assessment: post-procedure vital signs reviewed and stable Respiratory status: spontaneous breathing, nonlabored ventilation, respiratory function stable and patient connected to nasal cannula oxygen Cardiovascular status: blood pressure returned to baseline and stable Postop Assessment: no apparent nausea or vomiting Anesthetic complications: no    Last Vitals:  Vitals:   02/26/19 1142 02/26/19 1312  BP:  100/72  Pulse: 74 72  Resp: (!) 21   Temp:  36.8 C  SpO2: 100% 100%    Last Pain:  Vitals:   02/26/19 1312  TempSrc:   PainSc: 4                  Eun Vermeer L Amiel Mccaffrey

## 2019-02-26 NOTE — Transfer of Care (Signed)
Immediate Anesthesia Transfer of Care Note  Patient: Christina Golden Lem  Procedure(s) Performed: DILATATION AND EVACUATION (N/A ) OPERATIVE ULTRASOUND (N/A )  Patient Location: PACU  Anesthesia Type:General  Level of Consciousness: awake, alert  and oriented  Airway & Oxygen Therapy: Patient Spontanous Breathing and Patient connected to nasal cannula oxygen  Post-op Assessment: Report given to RN  Post vital signs: Reviewed and stable  Last Vitals:  Vitals Value Taken Time  BP 129/70 02/26/19 1026  Temp    Pulse 95 02/26/19 1028  Resp 16 02/26/19 1028  SpO2 95 % 02/26/19 1028  Vitals shown include unvalidated device data.  Last Pain:  Vitals:   02/26/19 0815  TempSrc: Oral  PainSc: 0-No pain      Patients Stated Pain Goal: 5 ([phone])  Complications: No apparent anesthesia complications

## 2019-02-26 NOTE — Progress Notes (Signed)
Patient seen and examined. Consent witnessed and signed. No changes noted. Update completed. BP 131/79   Pulse 86   Temp 97.9 F (36.6 C) (Oral)   Resp 16   Ht 5\' 8"  (1.727 m)   Wt 78.7 kg   LMP 11/24/2018 (Approximate)   SpO2 100%   Breastfeeding No   BMI 26.38 kg/m   CBC    Component Value Date/Time   WBC 7.0 02/26/2019 0823   RBC 4.85 02/26/2019 0823   HGB 14.0 02/26/2019 0823   HCT 43.9 02/26/2019 0823   PLT 289 02/26/2019 0823   MCV 90.5 02/26/2019 0823   MCH 28.9 02/26/2019 0823   MCHC 31.9 02/26/2019 0823   RDW 12.0 02/26/2019 0823

## 2019-02-26 NOTE — Anesthesia Procedure Notes (Signed)
Procedure Name: Intubation Date/Time: 02/26/2019 9:58 AM Performed by: Bonney Aid, CRNA Pre-anesthesia Checklist: Patient identified, Emergency Drugs available, Suction available and Patient being monitored Patient Re-evaluated:Patient Re-evaluated prior to induction Oxygen Delivery Method: Circle system utilized Preoxygenation: Pre-oxygenation with 100% oxygen Induction Type: IV induction Ventilation: Mask ventilation without difficulty Laryngoscope Size: Mac and 3 Grade View: Grade I Tube type: Oral Tube size: 7.0 mm Number of attempts: 1 Airway Equipment and Method: Stylet and Oral airway Placement Confirmation: ETT inserted through vocal cords under direct vision,  positive ETCO2 and breath sounds checked- equal and bilateral Secured at: 21 cm Tube secured with: Tape Dental Injury: Teeth and Oropharynx as per pre-operative assessment

## 2019-02-27 ENCOUNTER — Encounter (HOSPITAL_BASED_OUTPATIENT_CLINIC_OR_DEPARTMENT_OTHER): Payer: Self-pay | Admitting: Obstetrics and Gynecology

## 2019-02-27 LAB — RH IG WORKUP (INCLUDES ABO/RH)
ABO/RH(D): B NEG
Antibody Screen: NEGATIVE
Gestational Age(Wks): 13
Unit division: 0

## 2019-02-27 LAB — SURGICAL PATHOLOGY

## 2019-03-12 DIAGNOSIS — O039 Complete or unspecified spontaneous abortion without complication: Secondary | ICD-10-CM | POA: Diagnosis not present

## 2019-03-20 ENCOUNTER — Encounter (HOSPITAL_COMMUNITY): Payer: Self-pay | Admitting: Obstetrics and Gynecology

## 2019-03-26 LAB — MISC LABCORP TEST (SEND OUT)
LabCorp test name: 511880
Labcorp test code: 511880

## 2019-04-06 DIAGNOSIS — C039 Malignant neoplasm of gum, unspecified: Secondary | ICD-10-CM | POA: Diagnosis not present

## 2019-04-06 DIAGNOSIS — O039 Complete or unspecified spontaneous abortion without complication: Secondary | ICD-10-CM | POA: Diagnosis not present

## 2019-04-21 DIAGNOSIS — M9905 Segmental and somatic dysfunction of pelvic region: Secondary | ICD-10-CM | POA: Diagnosis not present

## 2019-04-21 DIAGNOSIS — M9903 Segmental and somatic dysfunction of lumbar region: Secondary | ICD-10-CM | POA: Diagnosis not present

## 2019-04-21 DIAGNOSIS — M9904 Segmental and somatic dysfunction of sacral region: Secondary | ICD-10-CM | POA: Diagnosis not present

## 2019-04-21 DIAGNOSIS — M9901 Segmental and somatic dysfunction of cervical region: Secondary | ICD-10-CM | POA: Diagnosis not present

## 2019-04-22 DIAGNOSIS — M9905 Segmental and somatic dysfunction of pelvic region: Secondary | ICD-10-CM | POA: Diagnosis not present

## 2019-04-22 DIAGNOSIS — M9904 Segmental and somatic dysfunction of sacral region: Secondary | ICD-10-CM | POA: Diagnosis not present

## 2019-04-22 DIAGNOSIS — M9903 Segmental and somatic dysfunction of lumbar region: Secondary | ICD-10-CM | POA: Diagnosis not present

## 2019-04-22 DIAGNOSIS — M9901 Segmental and somatic dysfunction of cervical region: Secondary | ICD-10-CM | POA: Diagnosis not present

## 2019-04-23 DIAGNOSIS — M9903 Segmental and somatic dysfunction of lumbar region: Secondary | ICD-10-CM | POA: Diagnosis not present

## 2019-04-23 DIAGNOSIS — M9901 Segmental and somatic dysfunction of cervical region: Secondary | ICD-10-CM | POA: Diagnosis not present

## 2019-04-23 DIAGNOSIS — M9904 Segmental and somatic dysfunction of sacral region: Secondary | ICD-10-CM | POA: Diagnosis not present

## 2019-04-23 DIAGNOSIS — M9905 Segmental and somatic dysfunction of pelvic region: Secondary | ICD-10-CM | POA: Diagnosis not present

## 2019-04-27 DIAGNOSIS — M9901 Segmental and somatic dysfunction of cervical region: Secondary | ICD-10-CM | POA: Diagnosis not present

## 2019-04-27 DIAGNOSIS — M9903 Segmental and somatic dysfunction of lumbar region: Secondary | ICD-10-CM | POA: Diagnosis not present

## 2019-04-27 DIAGNOSIS — M9904 Segmental and somatic dysfunction of sacral region: Secondary | ICD-10-CM | POA: Diagnosis not present

## 2019-04-27 DIAGNOSIS — M9905 Segmental and somatic dysfunction of pelvic region: Secondary | ICD-10-CM | POA: Diagnosis not present

## 2019-04-29 ENCOUNTER — Other Ambulatory Visit: Payer: Self-pay

## 2019-04-29 DIAGNOSIS — M9905 Segmental and somatic dysfunction of pelvic region: Secondary | ICD-10-CM | POA: Diagnosis not present

## 2019-04-29 DIAGNOSIS — M9903 Segmental and somatic dysfunction of lumbar region: Secondary | ICD-10-CM | POA: Diagnosis not present

## 2019-04-29 DIAGNOSIS — M9901 Segmental and somatic dysfunction of cervical region: Secondary | ICD-10-CM | POA: Diagnosis not present

## 2019-04-29 DIAGNOSIS — Z139 Encounter for screening, unspecified: Secondary | ICD-10-CM

## 2019-04-29 DIAGNOSIS — M9904 Segmental and somatic dysfunction of sacral region: Secondary | ICD-10-CM | POA: Diagnosis not present

## 2019-05-01 LAB — NOVEL CORONAVIRUS, NAA: SARS-CoV-2, NAA: NOT DETECTED

## 2019-05-04 DIAGNOSIS — M9904 Segmental and somatic dysfunction of sacral region: Secondary | ICD-10-CM | POA: Diagnosis not present

## 2019-05-04 DIAGNOSIS — M9901 Segmental and somatic dysfunction of cervical region: Secondary | ICD-10-CM | POA: Diagnosis not present

## 2019-05-04 DIAGNOSIS — M9903 Segmental and somatic dysfunction of lumbar region: Secondary | ICD-10-CM | POA: Diagnosis not present

## 2019-05-04 DIAGNOSIS — M9905 Segmental and somatic dysfunction of pelvic region: Secondary | ICD-10-CM | POA: Diagnosis not present

## 2019-05-06 DIAGNOSIS — M9903 Segmental and somatic dysfunction of lumbar region: Secondary | ICD-10-CM | POA: Diagnosis not present

## 2019-05-06 DIAGNOSIS — M9904 Segmental and somatic dysfunction of sacral region: Secondary | ICD-10-CM | POA: Diagnosis not present

## 2019-05-06 DIAGNOSIS — M9901 Segmental and somatic dysfunction of cervical region: Secondary | ICD-10-CM | POA: Diagnosis not present

## 2019-05-06 DIAGNOSIS — M9905 Segmental and somatic dysfunction of pelvic region: Secondary | ICD-10-CM | POA: Diagnosis not present

## 2019-05-11 DIAGNOSIS — M9903 Segmental and somatic dysfunction of lumbar region: Secondary | ICD-10-CM | POA: Diagnosis not present

## 2019-05-11 DIAGNOSIS — M9905 Segmental and somatic dysfunction of pelvic region: Secondary | ICD-10-CM | POA: Diagnosis not present

## 2019-05-11 DIAGNOSIS — M9901 Segmental and somatic dysfunction of cervical region: Secondary | ICD-10-CM | POA: Diagnosis not present

## 2019-05-11 DIAGNOSIS — M9904 Segmental and somatic dysfunction of sacral region: Secondary | ICD-10-CM | POA: Diagnosis not present

## 2019-05-13 DIAGNOSIS — M9901 Segmental and somatic dysfunction of cervical region: Secondary | ICD-10-CM | POA: Diagnosis not present

## 2019-05-13 DIAGNOSIS — M9904 Segmental and somatic dysfunction of sacral region: Secondary | ICD-10-CM | POA: Diagnosis not present

## 2019-05-13 DIAGNOSIS — M9903 Segmental and somatic dysfunction of lumbar region: Secondary | ICD-10-CM | POA: Diagnosis not present

## 2019-05-13 DIAGNOSIS — M9905 Segmental and somatic dysfunction of pelvic region: Secondary | ICD-10-CM | POA: Diagnosis not present

## 2019-05-18 DIAGNOSIS — M9901 Segmental and somatic dysfunction of cervical region: Secondary | ICD-10-CM | POA: Diagnosis not present

## 2019-05-18 DIAGNOSIS — M9904 Segmental and somatic dysfunction of sacral region: Secondary | ICD-10-CM | POA: Diagnosis not present

## 2019-05-18 DIAGNOSIS — M9905 Segmental and somatic dysfunction of pelvic region: Secondary | ICD-10-CM | POA: Diagnosis not present

## 2019-05-18 DIAGNOSIS — M9903 Segmental and somatic dysfunction of lumbar region: Secondary | ICD-10-CM | POA: Diagnosis not present

## 2019-05-25 DIAGNOSIS — M9903 Segmental and somatic dysfunction of lumbar region: Secondary | ICD-10-CM | POA: Diagnosis not present

## 2019-05-25 DIAGNOSIS — M9901 Segmental and somatic dysfunction of cervical region: Secondary | ICD-10-CM | POA: Diagnosis not present

## 2019-05-25 DIAGNOSIS — M9905 Segmental and somatic dysfunction of pelvic region: Secondary | ICD-10-CM | POA: Diagnosis not present

## 2019-05-25 DIAGNOSIS — M9904 Segmental and somatic dysfunction of sacral region: Secondary | ICD-10-CM | POA: Diagnosis not present

## 2019-05-27 DIAGNOSIS — M9903 Segmental and somatic dysfunction of lumbar region: Secondary | ICD-10-CM | POA: Diagnosis not present

## 2019-05-27 DIAGNOSIS — M9901 Segmental and somatic dysfunction of cervical region: Secondary | ICD-10-CM | POA: Diagnosis not present

## 2019-05-27 DIAGNOSIS — M9905 Segmental and somatic dysfunction of pelvic region: Secondary | ICD-10-CM | POA: Diagnosis not present

## 2019-05-27 DIAGNOSIS — M9904 Segmental and somatic dysfunction of sacral region: Secondary | ICD-10-CM | POA: Diagnosis not present

## 2019-06-01 DIAGNOSIS — M9901 Segmental and somatic dysfunction of cervical region: Secondary | ICD-10-CM | POA: Diagnosis not present

## 2019-06-01 DIAGNOSIS — M9903 Segmental and somatic dysfunction of lumbar region: Secondary | ICD-10-CM | POA: Diagnosis not present

## 2019-06-01 DIAGNOSIS — M9905 Segmental and somatic dysfunction of pelvic region: Secondary | ICD-10-CM | POA: Diagnosis not present

## 2019-06-01 DIAGNOSIS — M9904 Segmental and somatic dysfunction of sacral region: Secondary | ICD-10-CM | POA: Diagnosis not present

## 2019-06-03 DIAGNOSIS — M9903 Segmental and somatic dysfunction of lumbar region: Secondary | ICD-10-CM | POA: Diagnosis not present

## 2019-06-03 DIAGNOSIS — M9905 Segmental and somatic dysfunction of pelvic region: Secondary | ICD-10-CM | POA: Diagnosis not present

## 2019-06-03 DIAGNOSIS — M9904 Segmental and somatic dysfunction of sacral region: Secondary | ICD-10-CM | POA: Diagnosis not present

## 2019-06-03 DIAGNOSIS — M9901 Segmental and somatic dysfunction of cervical region: Secondary | ICD-10-CM | POA: Diagnosis not present

## 2019-06-09 LAB — OB RESULTS CONSOLE GBS: GBS: NEGATIVE

## 2019-09-30 DIAGNOSIS — Z6832 Body mass index (BMI) 32.0-32.9, adult: Secondary | ICD-10-CM | POA: Diagnosis not present

## 2019-09-30 DIAGNOSIS — Z01419 Encounter for gynecological examination (general) (routine) without abnormal findings: Secondary | ICD-10-CM | POA: Diagnosis not present

## 2019-09-30 DIAGNOSIS — O039 Complete or unspecified spontaneous abortion without complication: Secondary | ICD-10-CM | POA: Diagnosis not present

## 2019-09-30 DIAGNOSIS — Z1151 Encounter for screening for human papillomavirus (HPV): Secondary | ICD-10-CM | POA: Diagnosis not present

## 2019-10-30 DIAGNOSIS — Z3A08 8 weeks gestation of pregnancy: Secondary | ICD-10-CM | POA: Diagnosis not present

## 2019-10-30 DIAGNOSIS — O09291 Supervision of pregnancy with other poor reproductive or obstetric history, first trimester: Secondary | ICD-10-CM | POA: Diagnosis not present

## 2019-10-30 DIAGNOSIS — Z3689 Encounter for other specified antenatal screening: Secondary | ICD-10-CM | POA: Diagnosis not present

## 2019-10-30 DIAGNOSIS — Z3200 Encounter for pregnancy test, result unknown: Secondary | ICD-10-CM | POA: Diagnosis not present

## 2019-11-03 DIAGNOSIS — O09291 Supervision of pregnancy with other poor reproductive or obstetric history, first trimester: Secondary | ICD-10-CM | POA: Diagnosis not present

## 2019-11-03 DIAGNOSIS — Z3A08 8 weeks gestation of pregnancy: Secondary | ICD-10-CM | POA: Diagnosis not present

## 2019-11-05 DIAGNOSIS — Z3A08 8 weeks gestation of pregnancy: Secondary | ICD-10-CM | POA: Diagnosis not present

## 2019-11-05 DIAGNOSIS — O09291 Supervision of pregnancy with other poor reproductive or obstetric history, first trimester: Secondary | ICD-10-CM | POA: Diagnosis not present

## 2019-11-11 DIAGNOSIS — O09291 Supervision of pregnancy with other poor reproductive or obstetric history, first trimester: Secondary | ICD-10-CM | POA: Diagnosis not present

## 2019-11-11 DIAGNOSIS — Z3A01 Less than 8 weeks gestation of pregnancy: Secondary | ICD-10-CM | POA: Diagnosis not present

## 2019-11-19 DIAGNOSIS — Z6828 Body mass index (BMI) 28.0-28.9, adult: Secondary | ICD-10-CM | POA: Diagnosis not present

## 2019-11-19 DIAGNOSIS — E559 Vitamin D deficiency, unspecified: Secondary | ICD-10-CM | POA: Diagnosis not present

## 2019-11-19 DIAGNOSIS — E538 Deficiency of other specified B group vitamins: Secondary | ICD-10-CM | POA: Diagnosis not present

## 2019-11-26 DIAGNOSIS — Z3201 Encounter for pregnancy test, result positive: Secondary | ICD-10-CM | POA: Diagnosis not present

## 2019-12-17 DIAGNOSIS — O09291 Supervision of pregnancy with other poor reproductive or obstetric history, first trimester: Secondary | ICD-10-CM | POA: Diagnosis not present

## 2019-12-17 DIAGNOSIS — Z3689 Encounter for other specified antenatal screening: Secondary | ICD-10-CM | POA: Diagnosis not present

## 2019-12-17 LAB — OB RESULTS CONSOLE HEPATITIS B SURFACE ANTIGEN: Hepatitis B Surface Ag: NEGATIVE

## 2019-12-17 LAB — OB RESULTS CONSOLE ABO/RH: RH Type: NEGATIVE

## 2019-12-17 LAB — OB RESULTS CONSOLE HIV ANTIBODY (ROUTINE TESTING): HIV: NONREACTIVE

## 2019-12-17 LAB — OB RESULTS CONSOLE RPR: RPR: NONREACTIVE

## 2019-12-17 LAB — OB RESULTS CONSOLE GC/CHLAMYDIA
Chlamydia: NEGATIVE
Gonorrhea: NEGATIVE

## 2019-12-17 LAB — OB RESULTS CONSOLE RUBELLA ANTIBODY, IGM: Rubella: IMMUNE

## 2020-01-07 DIAGNOSIS — Z3A13 13 weeks gestation of pregnancy: Secondary | ICD-10-CM | POA: Diagnosis not present

## 2020-01-07 DIAGNOSIS — O09291 Supervision of pregnancy with other poor reproductive or obstetric history, first trimester: Secondary | ICD-10-CM | POA: Diagnosis not present

## 2020-01-11 ENCOUNTER — Other Ambulatory Visit: Payer: Self-pay

## 2020-01-11 ENCOUNTER — Ambulatory Visit
Admission: EM | Admit: 2020-01-11 | Discharge: 2020-01-11 | Disposition: A | Discharge status: Home / Self Care | Payer: 59 | Attending: Emergency Medicine | Admitting: Emergency Medicine

## 2020-01-11 DIAGNOSIS — Z1152 Encounter for screening for COVID-19: Secondary | ICD-10-CM | POA: Diagnosis not present

## 2020-01-11 DIAGNOSIS — J069 Acute upper respiratory infection, unspecified: Secondary | ICD-10-CM

## 2020-01-11 NOTE — Discharge Instructions (Addendum)
COVID testing ordered.  It will take between 2-7 days for test results.  Someone will contact you regarding abnormal results.    In the meantime: You should remain isolated in your home for 10 days from symptom onset AND greater than 24 hours after symptoms resolution (absence of fever without the use of fever-reducing medication and improvement in respiratory symptoms), whichever is longer Get plenty of rest and push fluids Safe medication list during pregnancy was provided Use medications daily for symptom relief Use OTC medications like ibuprofen or tylenol as needed fever or pain Call or go to the ED if you have any new or worsening symptoms such as fever, worsening cough, shortness of breath, chest tightness, chest pain, turning blue, changes in mental status, etc..

## 2020-01-11 NOTE — ED Triage Notes (Signed)
Pt presents with c/o nasal congestion for past couple days

## 2020-01-11 NOTE — ED Provider Notes (Signed)
[place]   [id number] 01/11/20 Arrival Time: 7425   Chief Complaint  Patient presents with   Nasal Congestion     SUBJECTIVE: History from: patient.  Christina Golden is a 34 y.o. female who is pregnant presented to the urgent care with a complaint of nasal congestion, cough and postnasal drip for the past couple days.  Denies sick exposure to COVID, flu or strep.  Denies recent travel.  Has tried OTC medication without relief.  Denies alleviating or aggravating factors.  Denies previous symptoms in the past.   Denies fever, chills, fatigue, sinus pain, rhinorrhea, sore throat, SOB, wheezing, chest pain, nausea, changes in bowel or bladder habits.     ROS: As per HPI.  All other pertinent ROS negative.      Past Medical History:  Diagnosis Date   ADHD    GERD (gastroesophageal reflux disease)    Hiatal hernia    Past Surgical History:  Procedure Laterality Date   DILATION AND EVACUATION N/A 02/26/2019   Procedure: DILATATION AND EVACUATION;  Surgeon: Brien Few, MD;  Location: [place];  Service: Gynecology;  Laterality: N/A;   ESOPHAGOGASTRODUODENOSCOPY (EGD) WITH PROPOFOL  09-15-2004  dr Gala Romney   OPERATIVE ULTRASOUND N/A 02/26/2019   Procedure: OPERATIVE ULTRASOUND;  Surgeon: Brien Few, MD;  Location: [place];  Service: Gynecology;  Laterality: N/A;   WISDOM TOOTH EXTRACTION  2004   Allergies  Allergen Reactions   Sulfa Antibiotics Rash   No current facility-administered medications on file prior to encounter.   Current Outpatient Medications on File Prior to Encounter  Medication Sig Dispense Refill   amphetamine-dextroamphetamine (ADDERALL) 30 MG tablet Take 30 mg by mouth daily.     famotidine (PEPCID) 20 MG tablet Take 20 mg by mouth daily.     Prenatal Vit-Fe Fumarate-FA (MULTIVITAMIN-PRENATAL) 27-0.8 MG TABS tablet Take 1 tablet by mouth daily at 12 noon.     traMADol (ULTRAM) 50 MG tablet Take  1-2 tablets (50-100 mg total) by mouth every 6 (six) hours as needed. 20 tablet 0   [DISCONTINUED] norethindrone-ethinyl estradiol (JUNEL FE,GILDESS FE,LOESTRIN FE) 1-20 MG-MCG tablet Take 1 tablet by mouth daily.     Social History   Socioeconomic History   Marital status: Married    Spouse name: Not on file   Number of children: Not on file   Years of education: Not on file   Highest education level: Not on file  Occupational History   Not on file  Tobacco Use   Smoking status: Never Smoker   Smokeless tobacco: Never Used  Vaping Use   Vaping Use: Never used  Substance and Sexual Activity   Alcohol use: Not Currently    Comment: Social   Drug use: No   Sexual activity: Yes    Birth control/protection: None  Other Topics Concern   Not on file  Social History Narrative   Not on file   Social Determinants of Health   Financial Resource Strain:    Difficulty of Paying Living Expenses:   Food Insecurity:    Worried About Charity fundraiser in the Last Year:    Arboriculturist in the Last Year:   Transportation Needs:    Film/video editor (Medical):    Lack of Transportation (Non-Medical):   Physical Activity:    Days of Exercise per Week:    Minutes of Exercise per Session:   Stress:    Feeling of Stress :  Social Connections:    Frequency of Communication with Friends and Family:    Frequency of Social Gatherings with Friends and Family:    Attends Religious Services:    Active Member of Clubs or Organizations:    Attends Music therapist:    Marital Status:   Intimate Partner Violence:    Fear of Current or Ex-Partner:    Emotionally Abused:    Physically Abused:    Sexually Abused:    Family History  Problem Relation Age of Onset   Hypertension Mother    Hypertension Father    Hyperlipidemia Father    Healthy Sister    Healthy Brother     OBJECTIVE:  Vitals:   01/11/20 1306  BP: 121/81    Pulse: 90  Resp: 16  Temp: 98 F (36.7 C)  TempSrc: Oral  SpO2: 98%     General appearance: alert; appears fatigued, but nontoxic; speaking in full sentences and tolerating own secretions HEENT: NCAT; Ears: EACs clear, TMs pearly gray; Eyes: PERRL.  EOM grossly intact. Sinuses: nontender; Nose: nares patent without rhinorrhea, Throat: oropharynx clear, tonsils non erythematous or enlarged, uvula midline  Neck: supple without LAD Lungs: unlabored respirations, symmetrical air entry; cough: mild; no respiratory distress; CTAB Heart: regular rate and rhythm.  Radial pulses 2+ symmetrical bilaterally Skin: warm and dry Psychological: alert and cooperative; normal mood and affect  LABS:  No results found for this or any previous visit (from the past 24 hour(s)).   ASSESSMENT & PLAN:  1. URI with cough and congestion   2. Encounter for screening for COVID-19     No orders of the defined types were placed in this encounter.  Discharge Instructions    COVID testing ordered.  It will take between 2-7 days for test results.  Someone will contact you regarding abnormal results.    In the meantime: You should remain isolated in your home for 10 days from symptom onset AND greater than 24 hours after symptoms resolution (absence of fever without the use of fever-reducing medication and improvement in respiratory symptoms), whichever is longer Get plenty of rest and push fluids Safe medication list during pregnancy was provided Use medications daily for symptom relief Use OTC medications like ibuprofen or tylenol as needed fever or pain Call or go to the ED if you have any new or worsening symptoms such as fever, worsening cough, shortness of breath, chest tightness, chest pain, turning blue, changes in mental status, etc...   Reviewed expectations re: course of current medical issues. Questions answered. Outlined signs and symptoms indicating need for more acute intervention. Patient  verbalized understanding. After Visit Summary given.      Note: This document was prepared using Dragon voice recognition software and may include unintentional dictation errors.    Emerson Monte, Manuel Garcia 01/11/20 1334

## 2020-01-12 LAB — SARS-COV-2, NAA 2 DAY TAT

## 2020-01-12 LAB — NOVEL CORONAVIRUS, NAA: SARS-CoV-2, NAA: NOT DETECTED

## 2020-01-15 ENCOUNTER — Other Ambulatory Visit: Payer: Self-pay | Admitting: Family

## 2020-01-15 MED ORDER — AZITHROMYCIN 250 MG PO TABS: ORAL_TABLET | ORAL | 0 refills | Status: DC

## 2020-01-15 NOTE — Progress Notes (Signed)
PT complaining of congestion, fever, and cough for greater than a week. I will send in zpak, but will only start if symptoms worsen.

## 2020-01-28 DIAGNOSIS — Z361 Encounter for antenatal screening for raised alphafetoprotein level: Secondary | ICD-10-CM | POA: Diagnosis not present

## 2020-02-18 DIAGNOSIS — Z363 Encounter for antenatal screening for malformations: Secondary | ICD-10-CM | POA: Diagnosis not present

## 2020-03-02 DIAGNOSIS — Z362 Encounter for other antenatal screening follow-up: Secondary | ICD-10-CM | POA: Diagnosis not present

## 2020-04-13 DIAGNOSIS — Z3689 Encounter for other specified antenatal screening: Secondary | ICD-10-CM | POA: Diagnosis not present

## 2020-04-25 DIAGNOSIS — Z3A29 29 weeks gestation of pregnancy: Secondary | ICD-10-CM | POA: Diagnosis not present

## 2020-04-25 DIAGNOSIS — O09293 Supervision of pregnancy with other poor reproductive or obstetric history, third trimester: Secondary | ICD-10-CM | POA: Diagnosis not present

## 2020-04-25 DIAGNOSIS — O360130 Maternal care for anti-D [Rh] antibodies, third trimester, not applicable or unspecified: Secondary | ICD-10-CM | POA: Diagnosis not present

## 2020-05-09 DIAGNOSIS — Z3480 Encounter for supervision of other normal pregnancy, unspecified trimester: Secondary | ICD-10-CM | POA: Diagnosis not present

## 2020-05-09 DIAGNOSIS — Z23 Encounter for immunization: Secondary | ICD-10-CM | POA: Diagnosis not present

## 2020-06-04 NOTE — L&D Delivery Note (Signed)
Operative Delivery Note At 10:15 PM a viable and healthy female was delivered via Vaginal, Vacuum Neurosurgeon).  Presentation: vertex; Position: Occiput,, Anterior; Station: +3.  Verbal consent: obtained from patient. Maternal exhaustion and poor maternal pushing efforts x 3 hrs. Risks and benefits discussed in detail.  Risks include, but are not limited to the risks of anesthesia, bleeding, infection, damage to maternal tissues, fetal cephalhematoma.  There is also the risk of inability to effect vaginal delivery of the head, or shoulder dystocia that cannot be resolved by established maneuvers, leading to the need for emergency cesarean section.  APGAR: 7, 9; weight  .   Placenta status:Manual  intact, .   Cord:  with the following complications: .na  Cord pH: na  Anesthesia:  epidural Instruments: kiwi x 3 pulls Episiotomy: None Lacerations:   Suture Repair: 2.0 vicryl rapide Est. Blood Loss (mL):  300  Mom to postpartum.  Baby to Couplet care / Skin to Skin.  Yailen Zemaitis J 07/06/2020, 10:52 PM

## 2020-06-08 DIAGNOSIS — Z3A35 35 weeks gestation of pregnancy: Secondary | ICD-10-CM | POA: Diagnosis not present

## 2020-06-08 DIAGNOSIS — Z8759 Personal history of other complications of pregnancy, childbirth and the puerperium: Secondary | ICD-10-CM | POA: Diagnosis not present

## 2020-06-08 DIAGNOSIS — Z3685 Encounter for antenatal screening for Streptococcus B: Secondary | ICD-10-CM | POA: Diagnosis not present

## 2020-06-08 DIAGNOSIS — O139 Gestational [pregnancy-induced] hypertension without significant proteinuria, unspecified trimester: Secondary | ICD-10-CM | POA: Diagnosis not present

## 2020-06-08 DIAGNOSIS — Z3A36 36 weeks gestation of pregnancy: Secondary | ICD-10-CM | POA: Diagnosis not present

## 2020-06-08 DIAGNOSIS — Z3483 Encounter for supervision of other normal pregnancy, third trimester: Secondary | ICD-10-CM | POA: Diagnosis not present

## 2020-06-08 DIAGNOSIS — O09293 Supervision of pregnancy with other poor reproductive or obstetric history, third trimester: Secondary | ICD-10-CM | POA: Diagnosis not present

## 2020-06-08 LAB — OB RESULTS CONSOLE GBS: GBS: NEGATIVE

## 2020-06-14 DIAGNOSIS — Z3A36 36 weeks gestation of pregnancy: Secondary | ICD-10-CM | POA: Diagnosis not present

## 2020-06-14 DIAGNOSIS — O139 Gestational [pregnancy-induced] hypertension without significant proteinuria, unspecified trimester: Secondary | ICD-10-CM | POA: Diagnosis not present

## 2020-06-14 DIAGNOSIS — Z8759 Personal history of other complications of pregnancy, childbirth and the puerperium: Secondary | ICD-10-CM | POA: Diagnosis not present

## 2020-06-16 DIAGNOSIS — R030 Elevated blood-pressure reading, without diagnosis of hypertension: Secondary | ICD-10-CM | POA: Diagnosis not present

## 2020-06-16 DIAGNOSIS — Z331 Pregnant state, incidental: Secondary | ICD-10-CM | POA: Diagnosis not present

## 2020-06-16 DIAGNOSIS — Z8759 Personal history of other complications of pregnancy, childbirth and the puerperium: Secondary | ICD-10-CM | POA: Diagnosis not present

## 2020-06-16 DIAGNOSIS — Z3A36 36 weeks gestation of pregnancy: Secondary | ICD-10-CM | POA: Diagnosis not present

## 2020-06-17 DIAGNOSIS — R030 Elevated blood-pressure reading, without diagnosis of hypertension: Secondary | ICD-10-CM | POA: Diagnosis not present

## 2020-06-17 DIAGNOSIS — Z3482 Encounter for supervision of other normal pregnancy, second trimester: Secondary | ICD-10-CM | POA: Diagnosis not present

## 2020-06-17 DIAGNOSIS — Z3483 Encounter for supervision of other normal pregnancy, third trimester: Secondary | ICD-10-CM | POA: Diagnosis not present

## 2020-06-21 DIAGNOSIS — Z8759 Personal history of other complications of pregnancy, childbirth and the puerperium: Secondary | ICD-10-CM | POA: Diagnosis not present

## 2020-06-21 DIAGNOSIS — Z3A37 37 weeks gestation of pregnancy: Secondary | ICD-10-CM | POA: Diagnosis not present

## 2020-06-21 DIAGNOSIS — O139 Gestational [pregnancy-induced] hypertension without significant proteinuria, unspecified trimester: Secondary | ICD-10-CM | POA: Diagnosis not present

## 2020-06-23 DIAGNOSIS — Z3A37 37 weeks gestation of pregnancy: Secondary | ICD-10-CM | POA: Diagnosis not present

## 2020-06-23 DIAGNOSIS — O133 Gestational [pregnancy-induced] hypertension without significant proteinuria, third trimester: Secondary | ICD-10-CM | POA: Diagnosis not present

## 2020-06-28 DIAGNOSIS — Z3A38 38 weeks gestation of pregnancy: Secondary | ICD-10-CM | POA: Diagnosis not present

## 2020-06-28 DIAGNOSIS — R030 Elevated blood-pressure reading, without diagnosis of hypertension: Secondary | ICD-10-CM | POA: Diagnosis not present

## 2020-06-28 DIAGNOSIS — O133 Gestational [pregnancy-induced] hypertension without significant proteinuria, third trimester: Secondary | ICD-10-CM | POA: Diagnosis not present

## 2020-06-28 DIAGNOSIS — Z8759 Personal history of other complications of pregnancy, childbirth and the puerperium: Secondary | ICD-10-CM | POA: Diagnosis not present

## 2020-07-01 DIAGNOSIS — O133 Gestational [pregnancy-induced] hypertension without significant proteinuria, third trimester: Secondary | ICD-10-CM | POA: Diagnosis not present

## 2020-07-01 DIAGNOSIS — Z3A38 38 weeks gestation of pregnancy: Secondary | ICD-10-CM | POA: Diagnosis not present

## 2020-07-05 ENCOUNTER — Inpatient Hospital Stay (HOSPITAL_COMMUNITY): Payer: 59

## 2020-07-05 ENCOUNTER — Other Ambulatory Visit: Payer: Self-pay | Admitting: Obstetrics and Gynecology

## 2020-07-05 ENCOUNTER — Other Ambulatory Visit: Payer: Self-pay

## 2020-07-05 ENCOUNTER — Encounter (HOSPITAL_COMMUNITY): Payer: Self-pay | Admitting: Obstetrics and Gynecology

## 2020-07-05 ENCOUNTER — Inpatient Hospital Stay (HOSPITAL_COMMUNITY)
Admission: AD | Admit: 2020-07-05 | Discharge: 2020-07-08 | DRG: 806 | Disposition: A | Discharge status: Home / Self Care | Payer: 59 | Attending: Obstetrics and Gynecology | Admitting: Obstetrics and Gynecology

## 2020-07-05 DIAGNOSIS — Z3A38 38 weeks gestation of pregnancy: Secondary | ICD-10-CM

## 2020-07-05 DIAGNOSIS — Z3A39 39 weeks gestation of pregnancy: Secondary | ICD-10-CM | POA: Diagnosis not present

## 2020-07-05 DIAGNOSIS — D62 Acute posthemorrhagic anemia: Secondary | ICD-10-CM | POA: Diagnosis not present

## 2020-07-05 DIAGNOSIS — O134 Gestational [pregnancy-induced] hypertension without significant proteinuria, complicating childbirth: Principal | ICD-10-CM | POA: Diagnosis present

## 2020-07-05 DIAGNOSIS — Z20822 Contact with and (suspected) exposure to covid-19: Secondary | ICD-10-CM | POA: Diagnosis not present

## 2020-07-05 DIAGNOSIS — R102 Pelvic and perineal pain: Secondary | ICD-10-CM | POA: Diagnosis not present

## 2020-07-05 DIAGNOSIS — O26893 Other specified pregnancy related conditions, third trimester: Secondary | ICD-10-CM | POA: Diagnosis not present

## 2020-07-05 DIAGNOSIS — O9081 Anemia of the puerperium: Secondary | ICD-10-CM | POA: Diagnosis not present

## 2020-07-05 DIAGNOSIS — Z6791 Unspecified blood type, Rh negative: Secondary | ICD-10-CM | POA: Diagnosis not present

## 2020-07-05 DIAGNOSIS — O169 Unspecified maternal hypertension, unspecified trimester: Secondary | ICD-10-CM

## 2020-07-05 DIAGNOSIS — Z3490 Encounter for supervision of normal pregnancy, unspecified, unspecified trimester: Secondary | ICD-10-CM | POA: Diagnosis present

## 2020-07-05 LAB — COMPREHENSIVE METABOLIC PANEL
ALT: 16 U/L (ref 0–44)
AST: 21 U/L (ref 15–41)
Albumin: 2.5 g/dL — ABNORMAL LOW (ref 3.5–5.0)
Alkaline Phosphatase: 190 U/L — ABNORMAL HIGH (ref 38–126)
Anion gap: 9 (ref 5–15)
BUN: 7 mg/dL (ref 6–20)
CO2: 19 mmol/L — ABNORMAL LOW (ref 22–32)
Calcium: 8.9 mg/dL (ref 8.9–10.3)
Chloride: 107 mmol/L (ref 98–111)
Creatinine, Ser: 0.46 mg/dL (ref 0.44–1.00)
GFR, Estimated: >60 mL/min (ref >60)
Glucose, Bld: 92 mg/dL (ref 70–99)
Potassium: 3.5 mmol/L (ref 3.5–5.1)
Sodium: 135 mmol/L (ref 135–145)
Total Bilirubin: 0.5 mg/dL (ref 0.3–1.2)
Total Protein: 6 g/dL — ABNORMAL LOW (ref 6.5–8.1)

## 2020-07-05 LAB — CBC
HCT: 34 % — ABNORMAL LOW (ref 36.0–46.0)
Hemoglobin: 10.5 g/dL — ABNORMAL LOW (ref 12.0–15.0)
MCH: 25.5 pg — ABNORMAL LOW (ref 26.0–34.0)
MCHC: 30.9 g/dL (ref 30.0–36.0)
MCV: 82.5 fL (ref 80.0–100.0)
Platelets: 294 10*3/uL (ref 150–400)
RBC: 4.12 MIL/uL (ref 3.87–5.11)
RDW: 14.6 % (ref 11.5–15.5)
WBC: 12.1 10*3/uL — ABNORMAL HIGH (ref 4.0–10.5)
nRBC: 0 % (ref 0.0–0.2)

## 2020-07-05 LAB — TYPE AND SCREEN
ABO/RH(D): B NEG
Antibody Screen: NEGATIVE

## 2020-07-05 LAB — SARS CORONAVIRUS 2 BY RT PCR (HOSPITAL ORDER, PERFORMED IN ~~LOC~~ HOSPITAL LAB): SARS Coronavirus 2: NEGATIVE

## 2020-07-05 MED ORDER — MISOPROSTOL 25 MCG QUARTER TABLET: 25.0000 ug | ORAL_TABLET | ORAL | Status: DC | PRN

## 2020-07-05 MED ORDER — EPHEDRINE 5 MG/ML INJ: 10.0000 mg | INTRAVENOUS | Status: DC | PRN

## 2020-07-05 MED ORDER — LACTATED RINGERS IV SOLN: INTRAVENOUS | Status: DC

## 2020-07-05 MED ORDER — LACTATED RINGERS IV SOLN: 500.0000 mL | INTRAVENOUS | Status: DC | PRN

## 2020-07-05 MED ORDER — FENTANYL-BUPIVACAINE-NACL 0.5-0.125-0.9 MG/250ML-% EP SOLN
12.0000 mL/h | EPIDURAL | Status: DC | PRN
  Filled 2020-07-05: qty 250

## 2020-07-05 MED ORDER — OXYTOCIN BOLUS FROM INFUSION
333.0000 mL | Freq: Once | INTRAVENOUS | Status: AC
  Administered 2020-07-06: 333 mL via INTRAVENOUS

## 2020-07-05 MED ORDER — TERBUTALINE SULFATE 1 MG/ML IJ SOLN: 0.2500 mg | Freq: Once | INTRAMUSCULAR | Status: DC | PRN

## 2020-07-05 MED ORDER — SOD CITRATE-CITRIC ACID 500-334 MG/5ML PO SOLN: 30.0000 mL | ORAL | Status: DC | PRN

## 2020-07-05 MED ORDER — LACTATED RINGERS IV SOLN: 500.0000 mL | Freq: Once | INTRAVENOUS | Status: DC

## 2020-07-05 MED ORDER — MISOPROSTOL 25 MCG QUARTER TABLET
25.0000 ug | ORAL_TABLET | Freq: Four times a day (QID) | ORAL | Status: DC
  Administered 2020-07-05 – 2020-07-06 (×3): 25 ug via VAGINAL
  Filled 2020-07-05 (×4): qty 1

## 2020-07-05 MED ORDER — PHENYLEPHRINE 40 MCG/ML (10ML) SYRINGE FOR IV PUSH (FOR BLOOD PRESSURE SUPPORT): 80.0000 ug | PREFILLED_SYRINGE | INTRAVENOUS | Status: DC | PRN

## 2020-07-05 MED ORDER — ACETAMINOPHEN 325 MG PO TABS
650.0000 mg | ORAL_TABLET | ORAL | Status: DC | PRN
  Administered 2020-07-06: 650 mg via ORAL
  Filled 2020-07-05: qty 2

## 2020-07-05 MED ORDER — LIDOCAINE HCL (PF) 1 % IJ SOLN: 30.0000 mL | INTRAMUSCULAR | Status: DC | PRN

## 2020-07-05 MED ORDER — OXYTOCIN-SODIUM CHLORIDE 30-0.9 UT/500ML-% IV SOLN: 1.0000 m[IU]/min | INTRAVENOUS | Status: DC

## 2020-07-05 MED ORDER — ZOLPIDEM TARTRATE 5 MG PO TABS
5.0000 mg | ORAL_TABLET | Freq: Every evening | ORAL | Status: DC | PRN
  Administered 2020-07-05: 5 mg via ORAL
  Filled 2020-07-05: qty 1

## 2020-07-05 MED ORDER — OXYTOCIN-SODIUM CHLORIDE 30-0.9 UT/500ML-% IV SOLN
1.0000 m[IU]/min | INTRAVENOUS | Status: DC
  Administered 2020-07-06: 2 m[IU]/min via INTRAVENOUS
  Filled 2020-07-05: qty 500

## 2020-07-05 MED ORDER — ONDANSETRON HCL 4 MG/2ML IJ SOLN
4.0000 mg | Freq: Four times a day (QID) | INTRAMUSCULAR | Status: DC | PRN
  Administered 2020-07-06: 4 mg via INTRAVENOUS
  Filled 2020-07-05: qty 2

## 2020-07-05 MED ORDER — OXYTOCIN-SODIUM CHLORIDE 30-0.9 UT/500ML-% IV SOLN
2.5000 [IU]/h | INTRAVENOUS | Status: DC
  Administered 2020-07-06: 2.5 [IU]/h via INTRAVENOUS
  Filled 2020-07-05: qty 500

## 2020-07-05 MED ORDER — DIPHENHYDRAMINE HCL 50 MG/ML IJ SOLN: 12.5000 mg | INTRAMUSCULAR | Status: DC | PRN

## 2020-07-05 MED ORDER — PHENYLEPHRINE 40 MCG/ML (10ML) SYRINGE FOR IV PUSH (FOR BLOOD PRESSURE SUPPORT)
80.0000 ug | PREFILLED_SYRINGE | INTRAVENOUS | Status: DC | PRN
  Filled 2020-07-05: qty 10

## 2020-07-05 NOTE — H&P (Signed)
Christina Golden is a 35 y.o. female presenting for IOL for gest vs chronic htn. OB History    Gravida  2   Para      Term      Preterm      AB  1   Living        SAB  1   IAB      Ectopic      Multiple      Live Births  0          Past Medical History:  Diagnosis Date  . ADHD   . GERD (gastroesophageal reflux disease)   . Hiatal hernia    Past Surgical History:  Procedure Laterality Date  . DILATION AND EVACUATION N/A 02/26/2019   Procedure: DILATATION AND EVACUATION;  Surgeon: Brien Few, MD;  Location: [facility];  Service: Gynecology;  Laterality: N/A;  . ESOPHAGOGASTRODUODENOSCOPY (EGD) WITH PROPOFOL  09-15-2004  dr Gala Romney  . OPERATIVE ULTRASOUND N/A 02/26/2019   Procedure: OPERATIVE ULTRASOUND;  Surgeon: Brien Few, MD;  Location: [facility];  Service: Gynecology;  Laterality: N/A;  . WISDOM TOOTH EXTRACTION  2004   Family History: family history includes Healthy in her brother and sister; Hyperlipidemia in her father; Hypertension in her father and mother. Social History:  reports that she has never smoked. She has never used smokeless tobacco. She reports previous alcohol use. She reports that she does not use drugs.     Maternal Diabetes: No Genetic Screening: Normal Maternal Ultrasounds/Referrals: Normal Fetal Ultrasounds or other Referrals:  None Maternal Substance Abuse:  No Significant Maternal Medications:  None Significant Maternal Lab Results:  Group B Strep negative Other Comments:  None  Review of Systems  Constitutional: Negative.   All other systems reviewed and are negative.  Maternal Medical History:  Reason for admission: Contractions.   Contractions: Onset was yesterday.   Frequency: rare.   Perceived severity is mild.    Fetal activity: Perceived fetal activity is normal.   Last perceived fetal movement was within the past hour.    Prenatal complications: PIH.   Prenatal  Complications - Diabetes: none.      Blood pressure (!) 151/93, pulse (!) 115, temperature 98.1 F (36.7 C), height 5\' 7"  (1.702 m), weight 114.4 kg. Maternal Exam:  Uterine Assessment: Contraction strength is mild.  Contraction frequency is rare.   Abdomen: Patient reports no abdominal tenderness. Fetal presentation: vertex  Introitus: Normal vulva. Normal vagina.  Ferning test: not done.  Amniotic fluid character: not assessed.  Pelvis: questionable for delivery.   Cervix: Cervix evaluated by digital exam.     Physical Exam Vitals and nursing note reviewed. Exam conducted with a chaperone present.  Constitutional:      Appearance: Normal appearance.  HENT:     Head: Normocephalic and atraumatic.  Cardiovascular:     Rate and Rhythm: Normal rate and regular rhythm.     Pulses: Normal pulses.     Heart sounds: Normal heart sounds.  Pulmonary:     Effort: Pulmonary effort is normal.     Breath sounds: Normal breath sounds.  Abdominal:     General: Abdomen is flat.     Palpations: Abdomen is soft.  Genitourinary:    General: Normal vulva.  Musculoskeletal:        General: Normal range of motion.     Cervical back: Normal range of motion.  Skin:    General: Skin is warm and dry.  Neurological:  General: No focal deficit present.     Mental Status: She is alert and oriented to person, place, and time.  Psychiatric:        Mood and Affect: Mood normal.        Behavior: Behavior normal.     Prenatal labs: ABO, Rh: B/Negative/-- (07/15 0000) Antibody:  neg Rubella: Immune (07/15 0000) RPR: Nonreactive (07/15 0000)  HBsAg: Negative (07/15 0000)  HIV: Non-reactive (07/15 0000)  GBS: Negative/-- (01/05 0000)   Assessment/Plan: 38wk IUP Gest vs chronic htn Situational anxiety IOL Ck labs   Christina Golden J 07/05/2020, 2:07 PM

## 2020-07-05 NOTE — Progress Notes (Signed)
Christina Golden is a 35 y.o. G2P0010 at [redacted]w[redacted]d by LMP admitted for induction of labor due to Hypertension.  Subjective: Good FM No headaches or visual changes  Objective: BP (!) 150/81   Pulse 91   Temp 98.8 F (37.1 C) (Oral)   Resp 18   Ht 5\' 7"  (1.702 m)   Wt 114.4 kg   BMI 39.48 kg/m  No intake/output data recorded. No intake/output data recorded.  FHT:  FHR: 155 bpm, variability: moderate,  accelerations:  Present,  decelerations:  Absent UC:   irregular, every 5 minutes SVE:   Dilation: Closed Effacement (%): 50 Station: -3 Exam by:: Marcene Duos, RN  Labs: Lab Results  Component Value Date   WBC 12.1 (H) 07/05/2020   HGB 10.5 (L) 07/05/2020   HCT 34.0 (L) 07/05/2020   MCV 82.5 07/05/2020   PLT 294 07/05/2020   CMP     Component Value Date/Time   NA 135 07/05/2020 1342   K 3.5 07/05/2020 1342   CL 107 07/05/2020 1342   CO2 19 (L) 07/05/2020 1342   GLUCOSE 92 07/05/2020 1342   BUN 7 07/05/2020 1342   CREATININE 0.46 07/05/2020 1342   CALCIUM 8.9 07/05/2020 1342   PROT 6.0 (L) 07/05/2020 1342   ALBUMIN 2.5 (L) 07/05/2020 1342   AST 21 07/05/2020 1342   ALT 16 07/05/2020 1342   ALKPHOS 190 (H) 07/05/2020 1342   BILITOT 0.5 07/05/2020 1342   GFRNONAA >60 07/05/2020 1342    Assessment / Plan: IOL Gestational HTN  Labor: Progressing normally Preeclampsia:  no signs or symptoms of toxicity and labs stable Fetal Wellbeing:  Category I Pain Control:  Labor support without medications I/D:  n/a Anticipated MOD:  NSVD  Hazelee Harbold J 07/05/2020, 9:32 PM

## 2020-07-06 ENCOUNTER — Encounter (HOSPITAL_COMMUNITY): Payer: Self-pay | Admitting: Obstetrics and Gynecology

## 2020-07-06 ENCOUNTER — Inpatient Hospital Stay (HOSPITAL_COMMUNITY): Payer: 59

## 2020-07-06 ENCOUNTER — Inpatient Hospital Stay (HOSPITAL_COMMUNITY): Payer: 59 | Admitting: Anesthesiology

## 2020-07-06 ENCOUNTER — Inpatient Hospital Stay (HOSPITAL_COMMUNITY): Admission: RE | Admit: 2020-07-06 | Payer: 59 | Source: Home / Self Care | Admitting: Obstetrics and Gynecology

## 2020-07-06 DIAGNOSIS — R102 Pelvic and perineal pain: Secondary | ICD-10-CM | POA: Diagnosis not present

## 2020-07-06 LAB — CBC WITH DIFFERENTIAL/PLATELET
Abs Immature Granulocytes: 0.18 10*3/uL — ABNORMAL HIGH (ref 0.00–0.07)
Basophils Absolute: 0 10*3/uL (ref 0.0–0.1)
Basophils Relative: 0 %
Eosinophils Absolute: 0 10*3/uL (ref 0.0–0.5)
Eosinophils Relative: 0 %
HCT: 32 % — ABNORMAL LOW (ref 36.0–46.0)
Hemoglobin: 9.8 g/dL — ABNORMAL LOW (ref 12.0–15.0)
Immature Granulocytes: 1 %
Lymphocytes Relative: 9 %
Lymphs Abs: 1.7 10*3/uL (ref 0.7–4.0)
MCH: 25.1 pg — ABNORMAL LOW (ref 26.0–34.0)
MCHC: 30.6 g/dL (ref 30.0–36.0)
MCV: 82.1 fL (ref 80.0–100.0)
Monocytes Absolute: 1.7 10*3/uL — ABNORMAL HIGH (ref 0.1–1.0)
Monocytes Relative: 9 %
Neutro Abs: 14.8 10*3/uL — ABNORMAL HIGH (ref 1.7–7.7)
Neutrophils Relative %: 81 %
Platelets: 258 10*3/uL (ref 150–400)
RBC: 3.9 MIL/uL (ref 3.87–5.11)
RDW: 14.7 % (ref 11.5–15.5)
WBC: 18.3 10*3/uL — ABNORMAL HIGH (ref 4.0–10.5)
nRBC: 0 % (ref 0.0–0.2)

## 2020-07-06 LAB — CBC
HCT: 31.7 % — ABNORMAL LOW (ref 36.0–46.0)
Hemoglobin: 10.3 g/dL — ABNORMAL LOW (ref 12.0–15.0)
MCH: 26.1 pg (ref 26.0–34.0)
MCHC: 32.5 g/dL (ref 30.0–36.0)
MCV: 80.3 fL (ref 80.0–100.0)
Platelets: 284 10*3/uL (ref 150–400)
RBC: 3.95 MIL/uL (ref 3.87–5.11)
RDW: 14.7 % (ref 11.5–15.5)
WBC: 13.4 10*3/uL — ABNORMAL HIGH (ref 4.0–10.5)
nRBC: 0 % (ref 0.0–0.2)

## 2020-07-06 LAB — RPR: RPR Ser Ql: NONREACTIVE

## 2020-07-06 MED ORDER — PROMETHAZINE HCL 25 MG/ML IJ SOLN
12.5000 mg | Freq: Four times a day (QID) | INTRAMUSCULAR | Status: DC | PRN
  Administered 2020-07-06: 12.5 mg via INTRAVENOUS
  Filled 2020-07-06: qty 1

## 2020-07-06 MED ORDER — LIDOCAINE HCL (PF) 1 % IJ SOLN
INTRAMUSCULAR | Status: DC | PRN
  Administered 2020-07-06: 5 mL via EPIDURAL

## 2020-07-06 MED ORDER — FENTANYL CITRATE (PF) 100 MCG/2ML IJ SOLN
INTRAMUSCULAR | Status: AC
  Filled 2020-07-06: qty 2

## 2020-07-06 MED ORDER — FENTANYL-BUPIVACAINE-NACL 0.5-0.125-0.9 MG/250ML-% EP SOLN
EPIDURAL | Status: DC | PRN
  Administered 2020-07-06: 12 mL/h via EPIDURAL

## 2020-07-06 MED ORDER — FENTANYL CITRATE (PF) 100 MCG/2ML IJ SOLN
100.0000 ug | INTRAMUSCULAR | Status: DC | PRN
  Administered 2020-07-06 (×2): 100 ug via INTRAVENOUS
  Filled 2020-07-06: qty 2

## 2020-07-06 NOTE — Progress Notes (Addendum)
Christina Golden is a 35 y.o. G2P0010 at [redacted]w[redacted]d by LMP admitted for induction of labor due to Hypertension.  Subjective: Comfortable No headaches or visual changes  Objective: BP 130/71   Pulse 94   Temp 98.7 F (37.1 C) (Oral)   Resp 16   Ht 5\' 7"  (1.702 m)   Wt 114.4 kg   SpO2 100%   BMI 39.48 kg/m  No intake/output data recorded. No intake/output data recorded.  FHT:  FHR: 155 bpm, variability: moderate,  accelerations:  Present,  decelerations:  Absent UC:   irregular, every 5 minutes SVE:   Dilation: 5 Effacement (%): 70 Station: -1,0 Exam by:: Dr. Ronita Hipps  IUPC placed without difficulty  Labs: Lab Results  Component Value Date   WBC 13.4 (H) 07/06/2020   HGB 10.3 (L) 07/06/2020   HCT 31.7 (L) 07/06/2020   MCV 80.3 07/06/2020   PLT 284 07/06/2020   CMP     Component Value Date/Time   NA 135 07/05/2020 1342   K 3.5 07/05/2020 1342   CL 107 07/05/2020 1342   CO2 19 (L) 07/05/2020 1342   GLUCOSE 92 07/05/2020 1342   BUN 7 07/05/2020 1342   CREATININE 0.46 07/05/2020 1342   CALCIUM 8.9 07/05/2020 1342   PROT 6.0 (L) 07/05/2020 1342   ALBUMIN 2.5 (L) 07/05/2020 1342   AST 21 07/05/2020 1342   ALT 16 07/05/2020 1342   ALKPHOS 190 (H) 07/05/2020 1342   BILITOT 0.5 07/05/2020 1342   GFRNONAA >60 07/05/2020 1342    Assessment / Plan: IOL Gestational HTN  Labor: Progressing normally Preeclampsia:  no signs or symptoms of toxicity and labs stable Fetal Wellbeing:  Category I Pain Control:  epidural I/D:  n/a Anticipated MOD:  guarded  Saaya Procell J 07/06/2020, 2:01 PM

## 2020-07-06 NOTE — Anesthesia Procedure Notes (Signed)
Epidural Patient location during procedure: OB Start time: 07/06/2020 11:05 AM End time: 07/06/2020 11:17 AM  Staffing Anesthesiologist: Barnet Glasgow, MD Performed: anesthesiologist   Preanesthetic Checklist Completed: patient identified, IV checked, site marked, risks and benefits discussed, surgical consent, monitors and equipment checked, pre-op evaluation and timeout performed  Epidural Patient position: sitting Prep: DuraPrep and site prepped and draped Patient monitoring: continuous pulse ox and blood pressure Approach: midline Location: L3-L4 Injection technique: LOR air  Needle:  Needle type: Tuohy  Needle gauge: 17 G Needle length: 9 cm and 9 Needle insertion depth: 7 cm Catheter type: closed end flexible Catheter size: 19 Gauge Catheter at skin depth: 13 cm Test dose: negative  Assessment Events: blood not aspirated, injection not painful, no injection resistance, no paresthesia and negative IV test  Additional Notes Patient identified. Risks/Benefits/Options discussed with patient including but not limited to bleeding, infection, nerve damage, paralysis, failed block, incomplete pain control, headache, blood pressure changes, nausea, vomiting, reactions to medication both or allergic, itching and postpartum back pain. Confirmed with bedside nurse the patient's most recent platelet count. Confirmed with patient that they are not currently taking any anticoagulation, have any bleeding history or any family history of bleeding disorders. Patient expressed understanding and wished to proceed. All questions were answered. Sterile technique was used throughout the entire procedure. Please see nursing notes for vital signs. Test dose was given through epidural needle and negative prior to continuing to dose epidural or start infusion. Warning signs of high block given to the patient including shortness of breath, tingling/numbness in hands, complete motor block, or any  concerning symptoms with instructions to call for help. Patient was given instructions on fall risk and not to get out of bed. All questions and concerns addressed with instructions to call with any issues.  1 Attempt (S) . Patient tolerated procedure well.

## 2020-07-06 NOTE — Lactation Note (Signed)
This note was copied from a baby's chart. Lactation Consultation Note Baby less than hr old. Baby fussy, vacuum assist delivery. Assisted in cradle position. Baby not latching. Mom has flat nipples. Slight edema around nipple base. Massage, reverse pressure helpful. Latched baby in football position. Baby held nipple in mouth at first then finally started suckling. Mom stated painful. LC unlatched and re-latched. Mom stated better. Baby suckling well at intervals when [facility] left. Will f/u on MBU.  Patient Name: Christina Golden JOINO'M Date: 07/06/2020 Reason for consult: L&D Initial assessment;Primapara;Term Age:60 hours  Maternal Data    Feeding    LATCH Score Latch: Repeated attempts needed to sustain latch, nipple held in mouth throughout feeding, stimulation needed to elicit sucking reflex.  Audible Swallowing: None  Type of Nipple: Flat  Comfort (Breast/Nipple): Soft / non-tender  Hold (Positioning): Assistance needed to correctly position infant at breast and maintain latch.  LATCH Score: 5   Lactation Tools Discussed/Used    Interventions Interventions: Breast feeding basics reviewed;Support pillows;Assisted with latch;Position options;Skin to skin;Breast massage;Hand express;Breast compression;Adjust position  Discharge    Consult Status Consult Status: Follow-up Date: 07/07/20 Follow-up type: In-patient    Theodoro Kalata 07/06/2020, 11:05 PM

## 2020-07-06 NOTE — Anesthesia Preprocedure Evaluation (Addendum)
Anesthesia Evaluation  Patient identified by MRN, date of birth, ID band Patient awake    Reviewed: Allergy & Precautions, NPO status , Patient's Chart, lab work & pertinent test results  Airway Mallampati: II  TM Distance: >3 FB Neck ROM: Full    Dental no notable dental hx. (+) Teeth Intact, Dental Advisory Given   Pulmonary neg pulmonary ROS,    Pulmonary exam normal breath sounds clear to auscultation       Cardiovascular Exercise Tolerance: Good negative cardio ROS Normal cardiovascular exam Rhythm:Regular Rate:Normal     Neuro/Psych ADHDnegative neurological ROS     GI/Hepatic Neg liver ROS, hiatal hernia, GERD  ,  Endo/Other  negative endocrine ROS  Renal/GU negative Renal ROS     Musculoskeletal   Abdominal (+) + obese,   Peds  Hematology Hgb 10.5 Plt 294   Anesthesia Other Findings   Reproductive/Obstetrics (+) Pregnancy                            Anesthesia Physical Anesthesia Plan  ASA: III  Anesthesia Plan: Epidural   Post-op Pain Management:    Induction:   PONV Risk Score and Plan:   Airway Management Planned:   Additional Equipment:   Intra-op Plan:   Post-operative Plan:   Informed Consent: I have reviewed the patients History and Physical, chart, labs and discussed the procedure including the risks, benefits and alternatives for the proposed anesthesia with the patient or authorized representative who has indicated his/her understanding and acceptance.       Plan Discussed with:   Anesthesia Plan Comments: (39.2 wk G2P0 for LEA)       Anesthesia Quick Evaluation

## 2020-07-06 NOTE — Progress Notes (Signed)
Christina Golden is a 35 y.o. G2P0010 at [redacted]w[redacted]d by LMP admitted for induction of labor due to Hypertension.  Subjective: Comfortable No headaches or visual changes  Objective: BP 122/77   Pulse 91   Temp 98.7 F (37.1 C) (Oral)   Resp 16   Ht 5\' 7"  (1.702 m)   Wt 114.4 kg   BMI 39.48 kg/m  No intake/output data recorded. No intake/output data recorded.  FHT:  FHR: 155 bpm, variability: moderate,  accelerations:  Present,  decelerations:  Absent UC:   irregular, every 5 minutes SVE:   Dilation: 2 Effacement (%): 60 Station: -3 Exam by:: Dr. Ronita Golden  AROM clear  Labs: Lab Results  Component Value Date   WBC 12.1 (H) 07/05/2020   HGB 10.5 (L) 07/05/2020   HCT 34.0 (L) 07/05/2020   MCV 82.5 07/05/2020   PLT 294 07/05/2020   CMP     Component Value Date/Time   NA 135 07/05/2020 1342   K 3.5 07/05/2020 1342   CL 107 07/05/2020 1342   CO2 19 (L) 07/05/2020 1342   GLUCOSE 92 07/05/2020 1342   BUN 7 07/05/2020 1342   CREATININE 0.46 07/05/2020 1342   CALCIUM 8.9 07/05/2020 1342   PROT 6.0 (L) 07/05/2020 1342   ALBUMIN 2.5 (L) 07/05/2020 1342   AST 21 07/05/2020 1342   ALT 16 07/05/2020 1342   ALKPHOS 190 (H) 07/05/2020 1342   BILITOT 0.5 07/05/2020 1342   GFRNONAA >60 07/05/2020 1342    Assessment / Plan: IOL Gestational HTN  Labor: Progressing normally Preeclampsia:  no signs or symptoms of toxicity and labs stable Fetal Wellbeing:  Category I Pain Control:  Labor support without medications I/D:  n/a Anticipated MOD:  guarded  Christina Golden J 07/06/2020, 8:11 AM

## 2020-07-06 NOTE — Progress Notes (Signed)
Christina Golden is a 35 y.o. G2P0010 at [redacted]w[redacted]d by LMP admitted for induction of labor due to Hypertension.  Subjective: Comfortable No headaches or visual changes  Objective: BP (!) 142/84   Pulse 85   Temp 98.9 F (37.2 C) (Oral)   Resp 16   Ht 5\' 7"  (1.702 m)   Wt 114.4 kg   SpO2 100%   BMI 39.48 kg/m  No intake/output data recorded. No intake/output data recorded.  FHT:  FHR: 155 bpm, variability: moderate,  accelerations:  Present,  decelerations:  Absent UC:   irregular, every 5 minutes SVE:   8/100/0/LOP IUPC approx 233mvu  Labs: Lab Results  Component Value Date   WBC 13.4 (H) 07/06/2020   HGB 10.3 (L) 07/06/2020   HCT 31.7 (L) 07/06/2020   MCV 80.3 07/06/2020   PLT 284 07/06/2020   CMP     Component Value Date/Time   NA 135 07/05/2020 1342   K 3.5 07/05/2020 1342   CL 107 07/05/2020 1342   CO2 19 (L) 07/05/2020 1342   GLUCOSE 92 07/05/2020 1342   BUN 7 07/05/2020 1342   CREATININE 0.46 07/05/2020 1342   CALCIUM 8.9 07/05/2020 1342   PROT 6.0 (L) 07/05/2020 1342   ALBUMIN 2.5 (L) 07/05/2020 1342   AST 21 07/05/2020 1342   ALT 16 07/05/2020 1342   ALKPHOS 190 (H) 07/05/2020 1342   BILITOT 0.5 07/05/2020 1342   GFRNONAA >60 07/05/2020 1342    Assessment / Plan: IOL Gestational HTN  Labor: Progressing normally Preeclampsia:  no signs or symptoms of toxicity and labs stable Fetal Wellbeing:  Category I Pain Control:  epidural I/D:  n/a Anticipated MOD:  guarded  Torence Palmeri J 07/06/2020, 6:46 PM

## 2020-07-07 DIAGNOSIS — O169 Unspecified maternal hypertension, unspecified trimester: Secondary | ICD-10-CM

## 2020-07-07 LAB — CBC
HCT: 28.6 % — ABNORMAL LOW (ref 36.0–46.0)
Hemoglobin: 8.7 g/dL — ABNORMAL LOW (ref 12.0–15.0)
MCH: 25.2 pg — ABNORMAL LOW (ref 26.0–34.0)
MCHC: 30.4 g/dL (ref 30.0–36.0)
MCV: 82.9 fL (ref 80.0–100.0)
Platelets: 251 10*3/uL (ref 150–400)
RBC: 3.45 MIL/uL — ABNORMAL LOW (ref 3.87–5.11)
RDW: 14.8 % (ref 11.5–15.5)
WBC: 18.3 10*3/uL — ABNORMAL HIGH (ref 4.0–10.5)
nRBC: 0 % (ref 0.0–0.2)

## 2020-07-07 MED ORDER — MAGNESIUM OXIDE 400 (241.3 MG) MG PO TABS
400.0000 mg | ORAL_TABLET | Freq: Every day | ORAL | Status: DC
  Administered 2020-07-07 – 2020-07-08 (×2): 400 mg via ORAL
  Filled 2020-07-07 (×2): qty 1

## 2020-07-07 MED ORDER — OXYCODONE HCL 5 MG PO TABS: 10.0000 mg | ORAL_TABLET | ORAL | Status: DC | PRN

## 2020-07-07 MED ORDER — SIMETHICONE 80 MG PO CHEW: 80.0000 mg | CHEWABLE_TABLET | ORAL | Status: DC | PRN

## 2020-07-07 MED ORDER — COCONUT OIL OIL
1.0000 | TOPICAL_OIL | Status: DC | PRN
Start: 2020-07-07 — End: 2020-07-08
  Administered 2020-07-07 (×2): 1 via TOPICAL

## 2020-07-07 MED ORDER — SENNOSIDES-DOCUSATE SODIUM 8.6-50 MG PO TABS
2.0000 | ORAL_TABLET | Freq: Every day | ORAL | Status: DC
  Administered 2020-07-07 – 2020-07-08 (×2): 2 via ORAL
  Filled 2020-07-07 (×2): qty 2

## 2020-07-07 MED ORDER — DIBUCAINE (PERIANAL) 1 % EX OINT: 1.0000 "application " | TOPICAL_OINTMENT | CUTANEOUS | Status: DC | PRN

## 2020-07-07 MED ORDER — METHYLERGONOVINE MALEATE 0.2 MG/ML IJ SOLN: 0.2000 mg | INTRAMUSCULAR | Status: DC | PRN

## 2020-07-07 MED ORDER — IBUPROFEN 600 MG PO TABS
600.0000 mg | ORAL_TABLET | Freq: Four times a day (QID) | ORAL | Status: DC
  Administered 2020-07-07 – 2020-07-08 (×7): 600 mg via ORAL
  Filled 2020-07-07 (×7): qty 1

## 2020-07-07 MED ORDER — POLYSACCHARIDE IRON COMPLEX 150 MG PO CAPS
150.0000 mg | ORAL_CAPSULE | Freq: Every day | ORAL | Status: DC
  Administered 2020-07-07 – 2020-07-08 (×2): 150 mg via ORAL
  Filled 2020-07-07 (×2): qty 1

## 2020-07-07 MED ORDER — WITCH HAZEL-GLYCERIN EX PADS: 1.0000 "application " | MEDICATED_PAD | CUTANEOUS | Status: DC | PRN

## 2020-07-07 MED ORDER — BENZOCAINE-MENTHOL 20-0.5 % EX AERO
1.0000 "application " | INHALATION_SPRAY | CUTANEOUS | Status: DC | PRN
  Administered 2020-07-07: 1 via TOPICAL
  Filled 2020-07-07: qty 56

## 2020-07-07 MED ORDER — ZOLPIDEM TARTRATE 5 MG PO TABS: 5.0000 mg | ORAL_TABLET | Freq: Every evening | ORAL | Status: DC | PRN

## 2020-07-07 MED ORDER — RHO D IMMUNE GLOBULIN 1500 UNIT/2ML IJ SOSY
300.0000 ug | PREFILLED_SYRINGE | Freq: Once | INTRAMUSCULAR | Status: AC
  Administered 2020-07-07: 300 ug via INTRAVENOUS
  Filled 2020-07-07: qty 2

## 2020-07-07 MED ORDER — DIPHENHYDRAMINE HCL 25 MG PO CAPS: 25.0000 mg | ORAL_CAPSULE | Freq: Four times a day (QID) | ORAL | Status: DC | PRN

## 2020-07-07 MED ORDER — ACETAMINOPHEN 325 MG PO TABS
650.0000 mg | ORAL_TABLET | ORAL | Status: DC | PRN
  Administered 2020-07-07: 650 mg via ORAL
  Filled 2020-07-07: qty 2

## 2020-07-07 MED ORDER — TETANUS-DIPHTH-ACELL PERTUSSIS 5-2.5-18.5 LF-MCG/0.5 IM SUSY: 0.5000 mL | PREFILLED_SYRINGE | Freq: Once | INTRAMUSCULAR | Status: DC

## 2020-07-07 MED ORDER — ACETAMINOPHEN 500 MG PO TABS
1000.0000 mg | ORAL_TABLET | Freq: Four times a day (QID) | ORAL | Status: DC
  Administered 2020-07-07 – 2020-07-08 (×4): 1000 mg via ORAL
  Filled 2020-07-07 (×4): qty 2

## 2020-07-07 MED ORDER — OXYCODONE HCL 5 MG PO TABS
5.0000 mg | ORAL_TABLET | ORAL | Status: DC | PRN
  Administered 2020-07-07 – 2020-07-08 (×3): 5 mg via ORAL
  Filled 2020-07-07 (×3): qty 1

## 2020-07-07 MED ORDER — METHYLERGONOVINE MALEATE 0.2 MG PO TABS
0.2000 mg | ORAL_TABLET | ORAL | Status: DC | PRN
Start: 2020-07-06 — End: 2020-07-08

## 2020-07-07 MED ORDER — ONDANSETRON HCL 4 MG/2ML IJ SOLN: 4.0000 mg | INTRAMUSCULAR | Status: DC | PRN

## 2020-07-07 MED ORDER — OXYCODONE-ACETAMINOPHEN 5-325 MG PO TABS
1.0000 | ORAL_TABLET | ORAL | Status: DC | PRN
Start: 2020-07-07 — End: 2020-07-07
  Administered 2020-07-07: 1 via ORAL
  Filled 2020-07-07: qty 1

## 2020-07-07 MED ORDER — PRENATAL MULTIVITAMIN CH
1.0000 | ORAL_TABLET | Freq: Every day | ORAL | Status: DC
  Administered 2020-07-07 – 2020-07-08 (×2): 1 via ORAL
  Filled 2020-07-07 (×2): qty 1

## 2020-07-07 MED ORDER — OXYCODONE-ACETAMINOPHEN 5-325 MG PO TABS
2.0000 | ORAL_TABLET | ORAL | Status: DC | PRN
Start: 2020-07-07 — End: 2020-07-07

## 2020-07-07 MED ORDER — OXYCODONE HCL 5 MG PO TABS: 5.0000 mg | ORAL_TABLET | ORAL | Status: DC | PRN

## 2020-07-07 MED ORDER — NIFEDIPINE ER OSMOTIC RELEASE 30 MG PO TB24
30.0000 mg | ORAL_TABLET | Freq: Every day | ORAL | Status: DC
  Administered 2020-07-07 – 2020-07-08 (×2): 30 mg via ORAL
  Filled 2020-07-07 (×2): qty 1

## 2020-07-07 MED ORDER — ONDANSETRON HCL 4 MG PO TABS: 4.0000 mg | ORAL_TABLET | ORAL | Status: DC | PRN

## 2020-07-07 NOTE — Lactation Note (Signed)
This note was copied from a baby's chart. Lactation Consultation Note  Patient Name: Christina Golden [id number] Date: 07/07/2020 Reason for consult: Follow-up assessment;Mother's request;Difficult latch;Primapara;1st time breastfeeding;Term Age:35 hours  Infant has a shallow latch according to Mom. Mom has compression stripe and bruise on the left breast. Mom given coconut oil by the RN for nipple care.  Mom states infant latching but it is painful.  Infant has adequate output.  LC attempted to latch infant on the left but she falls asleep. LC tried a 16 NS since not able to latch infant on the left side. Infant shallow latch and LC did some suck training.  LC reviewed benefits of supplementing with DBM. Mom stated she preferred to use formula chose Enfamil with Iron. LC used 0.5 ml of formula in 16 NS and infant latched on left for 12 minutes. Infant went to right without NS and latched for 10 minutes and still nursing at the end of the feed.  Plan 1. To feed based on cues, not time, 8-12x in 24 hour period no more than 3 hours without an attempt. Mom to STS look for signs of milk transfer try to latch at breast if needed use NS.            2. DEBP q 3 hours for 15 minutes. Mom to supplement with EBM or formula. Mom taught finger / spoon feeding. Parents did not want to use a bottle at this time.            All questions answered at the end of the visit.  Consult Status Consult Status: Follow-up Date: 07/08/20 Follow-up type: In-patient    Christina Lubeck  Golden 07/07/2020, 9:12 PM

## 2020-07-07 NOTE — Progress Notes (Signed)
Patient's BPs have been elevated today (139/96, 139/87, 142/88).  She is also having lower abdominal cramping pain unrelieved by ibuprofen and acetaminophen.  Phoned M. Sigmon, CNM about both issues above.  She will look at BP trends in the early evening and decide if an antihypertensive is needed.  She and Dr. Ronita Hipps have been in discussion about this.  See discontinue order of Percocet and new order of oxycodone IR to avoid acetaminophen overage.

## 2020-07-07 NOTE — Progress Notes (Addendum)
PPD #1, sp IOL for CHTN vs GHTN, VAVD, 2nd degree repair, baby girl "Laretta Alstrom"   S:  Reports feeling well, but sore. Denies HA, visual changes, RUQ/epigastric pain.  Reports swelling improved. C.o intense cramping at times             Tolerating po/ No nausea or vomiting / Denies dizziness or SOB, CP             Bleeding is moderate, small clot noted today and evaluated by me and appears to be normal             Pain controlled with Motrin and Tylenol             Up ad lib / ambulatory / voiding QS without difficulty  Newborn breast feeding - working with lactation  O:               VS: BP (!) 139/96 (BP Location: Left Arm)   Pulse 93   Temp 98.3 F (36.8 C) (Oral)   Resp 20   Ht 5\' 7"  (1.702 m)   Wt 114.4 kg   SpO2 98%   Breastfeeding Unknown   BMI 39.48 kg/m   Patient Vitals for the past 24 hrs:  BP Temp Temp src Pulse Resp SpO2  07/07/20 1038 (!) 139/96 98.3 F (36.8 C) Oral 93 20 98 %  07/07/20 0545 118/72 98 F (36.7 C) Oral 91 16 99 %  07/07/20 0045 116/74 98.5 F (36.9 C) Oral (!) 106 18 97 %  07/07/20 0016 139/77 - - (!) 104 - -  07/07/20 0001 138/81 - - (!) 102 - -  07/06/20 2346 (!) 144/89 - - (!) 112 18 -  07/06/20 2331 (!) 152/86 - - (!) 103 - -  07/06/20 2316 (!) 143/87 98.8 F (37.1 C) Oral (!) 115 18 -  07/06/20 2301 133/87 - - (!) 119 - -  07/06/20 2248 134/84 - - (!) 107 - -  07/06/20 2245 - - - - 18 -  07/06/20 2231 (!) 147/64 - - (!) 109 - -  07/06/20 2230 - - - - 18 -  07/06/20 2228 (!) 144/79 - - (!) 120 18 -  07/06/20 2131 129/78 - - (!) 137 - -  07/06/20 2101 (!) 112/57 - - 100 18 -  07/06/20 2100 - - - - - 95 %  07/06/20 2037 127/79 - - (!) 107 18 99 %  07/06/20 1931 (!) 148/84 98.8 F (37.1 C) - (!) 140 18 -  07/06/20 1835 - - - - 18 -  07/06/20 1800 - - - - 16 -  07/06/20 1731 (!) 142/84 98.9 F (37.2 C) Oral 85 16 -  07/06/20 1701 (!) 141/93 - - (!) 106 - -  07/06/20 1641 - - - - 16 -  07/06/20 1631 126/76 - - (!) 107 - -  07/06/20  1601 123/80 - - (!) 117 18 -  07/06/20 1531 132/84 - - (!) 125 - -  07/06/20 1501 127/78 (!) 97.4 F (36.3 C) Axillary 93 16 -  07/06/20 1431 117/76 - - (!) 125 - -  07/06/20 1401 128/71 - - 90 16 -  07/06/20 1331 130/71 - - 94 - -  07/06/20 1301 (!) 146/89 - - 79 16 -  07/06/20 1231 (!) 148/76 - - 84 - -  07/06/20 1211 139/82 - - 94 16 -  07/06/20 1206 (!) 150/71 - - 97 16 -  07/06/20 1201 (!) 157/91 - -  100 - -  07/06/20 1156 (!) 150/86 - - 89 - -  07/06/20 1151 (!) 151/102 - - (!) 131 - -  07/06/20 1150 (!) 151/102 - - (!) 131 - -  07/06/20 1146 (!) 142/85 - - 81 - -     LABS:             Recent Labs    07/06/20 2306 07/07/20 0725  WBC 18.3* 18.3*  HGB 9.8* 8.7*  PLT 258 251               Blood type: --/--/B NEG (02/03 0725)  Rubella: Immune (07/15 0000)                     I&O: Intake/Output      02/02 0701 02/03 0700 02/03 0701 02/04 0700   P.O. 0    I.V. (mL/kg) 0 (0)    Other 0    Total Intake(mL/kg) 0 (0)    Blood 250    Total Output 250    Net -250         Breastfed 1 x                  Physical Exam:             Alert and oriented X3  Lungs: Clear and unlabored  Heart: regular rate and rhythm / no murmurs  Abdomen: soft, non-tender, non-distended              Fundus: firm, non-tender, U-3  Perineum: well approximated 2nd degree, mild edema and ecchymosis   Lochia: small rubra on ice pack pad   Extremities: +1 LE edema, no calf pain or tenderness, +2 DTRs, no clonus bilaterally     A/P: PPD # 1, VAVD  2nd degree repair   CHTN vs. GHTN   - labile BPs this morning    - Monitor BP every 4hrs while awake to determine need for antihypertensive per MD consult   ABL Anemia    - asymptomatic   - Discussed IV iron option, but pt elects oral FE since asymptomatic   - Plan for Niferex 150mg  PO daily   - Magnesium oxide 400mg  PO daily   RH Negative/ Baby RH Positive   - Rhogam given  today  Hx. Of Situational anxiety    - s/p CSW, no meds at this  time  Heating pad PRN for cramping; increase tylenol 1000mg  PO every 6hrs scheduled  Doing well - stable status  Routine post partum orders  Lactation support PRN  Anticipate d/c home tomorrow depending on BPs  POC in consult with Dr. Stevan Born, MSN, CNM [facility]

## 2020-07-07 NOTE — Anesthesia Postprocedure Evaluation (Signed)
Anesthesia Post Note  Patient: Christina Golden  Procedure(s) Performed: AN AD HOC LABOR EPIDURAL     Patient location during evaluation: Mother Baby Anesthesia Type: Epidural Level of consciousness: awake, awake and alert and oriented Pain management: pain level controlled Vital Signs Assessment: post-procedure vital signs reviewed and stable Respiratory status: spontaneous breathing and respiratory function stable Cardiovascular status: blood pressure returned to baseline Postop Assessment: no headache, epidural receding, patient able to bend at knees, adequate PO intake, no backache, no apparent nausea or vomiting and able to ambulate Anesthetic complications: no   No complications documented.  Last Vitals:  Vitals:   07/07/20 0045 07/07/20 0545  BP: 116/74 118/72  Pulse: (!) 106 91  Resp: 18 16  Temp: 36.9 C 36.7 C  SpO2: 97% 99%    Last Pain:  Vitals:   07/07/20 0650  TempSrc:   PainSc: 4    Pain Goal:                   Bufford Spikes

## 2020-07-07 NOTE — Social Work (Signed)
CSW received consult for hx of Anxiety.  CSW met with MOB to offer support and complete assessment.     CSW introduced self and role. CSW observed MOB feeding baby and FOB on couch. CSW introduced self and role. MOB was pleasant and appropriate while interacting with baby. MOB declined speaking alone and stated FOB could remain in room. CSW informed MOB of reason for consult. MOB was understanding and reported she is currently doing really well. MOB disclosed she had some anxiety during pregnancy due to a previous miscarriage IN 2020. MOB stated she is experiencing no anxiety at this time. MOB reported she has never been to therapy or been prescribed medication for anxiety. MOB shared FOB and immediately family are all available and great supports. MOB denies any current SI or HI.   CSW provided education regarding the baby blues period vs. perinatal mood disorders and discussed treatment for mental health follow up if concerns arise.  CSW recommended self-evaluation during the postpartum time period using the New Mom Checklist from Postpartum Progress and encouraged MOB to contact a medical professional if symptoms are noted at any time.   CSW provided review of Sudden Infant Death Syndrome (SIDS) precautions.  MOB reported she has all essential needs for baby. MOB identified [facility] for follow-up care. MOB expressed no additional needs at this time.   CSW identifies no further need for intervention and no barriers to discharge at this time.  Darra Lis, [employer] [phone]

## 2020-07-08 LAB — RH IG WORKUP (INCLUDES ABO/RH)
ABO/RH(D): B NEG
Fetal Screen: NEGATIVE
Gestational Age(Wks): 39
Unit division: 0

## 2020-07-08 MED ORDER — BENZOCAINE-MENTHOL 20-0.5 % EX AERO: 1.0000 "application " | INHALATION_SPRAY | CUTANEOUS | Status: DC | PRN

## 2020-07-08 MED ORDER — POLYSACCHARIDE IRON COMPLEX 150 MG PO CAPS: 150.0000 mg | ORAL_CAPSULE | Freq: Every day | ORAL | Status: DC

## 2020-07-08 MED ORDER — MAGNESIUM OXIDE -MG SUPPLEMENT 400 (240 MG) MG PO TABS: 400.0000 mg | ORAL_TABLET | Freq: Every day | ORAL | Status: DC

## 2020-07-08 MED ORDER — NIFEDIPINE ER 30 MG PO TB24: 30.0000 mg | ORAL_TABLET | Freq: Every day | ORAL | 1 refills | Status: DC

## 2020-07-08 MED ORDER — COCONUT OIL OIL: 1.0000 "application " | TOPICAL_OIL | 0 refills | Status: DC | PRN

## 2020-07-08 MED ORDER — IBUPROFEN 600 MG PO TABS: 600.0000 mg | ORAL_TABLET | Freq: Four times a day (QID) | ORAL | 0 refills | Status: DC

## 2020-07-08 MED ORDER — ACETAMINOPHEN 500 MG PO TABS: 1000.0000 mg | ORAL_TABLET | Freq: Four times a day (QID) | ORAL | 0 refills | Status: DC

## 2020-07-08 NOTE — Lactation Note (Signed)
This note was copied from a baby's chart. Lactation Consultation Note  Patient Name: Christina Golden [id number] Date: 07/08/2020 Reason for consult: Follow-up assessment;Primapara;1st time breastfeeding;Term;Infant weight loss;Nipple pain/trauma Age:35 hours  When LC entered mom pumping with a DEBP - with #24 F comfortable.  Baby awake after pumping and mom receptive to assistance.  Per mom nipples are sore - LC noted positional strips on both nipples, and some areola edema.  LC showed mom reverse pressure, and worked with mom on latching after baby has opened wide and the latched with depth. ( see Latch score of 8 and details below )  LC added 5 F SNS after baby had been feeding for 5 mins and baby picked up her feeding patterns with increased swallows. Nipple well rounded when baby released after 15 mins.  See below for Educations and tools.  Per mom has the [facility] brochure with phone numbers.   Both mom and dad expressed appreciation for LC assist.   Maternal Data Has patient been taught Hand Expression?: Yes Does the patient have breastfeeding experience prior to this delivery?: No  Feeding Mother's Current Feeding Choice: Breast Milk and Formula Nipple Type: Slow - flow  LATCH Score Latch: Grasps breast easily, tongue down, lips flanged, rhythmical sucking.  Audible Swallowing: A few with stimulation (increased to 2 with the SNS)  Type of Nipple: Everted at rest and after stimulation (after pumping - areola edema)  Comfort (Breast/Nipple): Soft / non-tender  Hold (Positioning): Assistance needed to correctly position infant at breast and maintain latch.  LATCH Score: 8   Lactation Tools Discussed/Used Tools: Shells;Pump;Flanges;Coconut oil;Comfort gels;68F feeding tube / Syringe Nipple shield size:  (not using it to latch - to small) Flange Size: 24;27 Breast pump type: Manual;Double-Electric Breast Pump Pump Education: Setup, frequency, and cleaning;Milk Storage Pumped  volume: 0 mL  Interventions Interventions: Breast feeding basics reviewed;Assisted with latch;Skin to skin;Breast massage;Hand express;Pre-pump if needed;Breast compression;Adjust position;Support pillows;Position options;Expressed milk;Coconut oil;Shells;Comfort gels;Hand pump;DEBP;Education  Discharge - all D/C teaching completed.  Pump: Manual;DEBP;Personal (per mom DEBP - [employer]ra and a HAAKa) WIC Program: No  Consult Status Consult Status: Complete Date: 07/08/20 Follow-up type: Other (comment) (per mom plans to see the [facility] from [facility])    [place] 07/08/2020, 11:46 AM

## 2020-07-08 NOTE — Discharge Summary (Signed)
OB Discharge Summary  Patient Name: Christina Golden DOB: 28-Mar-1986 MRN: [id number]  Date of admission: 07/05/2020 Delivering provider: Brien Few   Admitting diagnosis: Encounter for elective induction of labor [Z34.90] Intrauterine pregnancy: [redacted]w[redacted]d     Secondary diagnosis: Patient Active Problem List   Diagnosis Date Noted  . Vacuum-assisted vaginal delivery 07/07/2020  . Postpartum care following VAVD (2/2) 07/07/2020  . Second degree perineal laceration 07/07/2020  . Hypertension affecting pregnancy 07/07/2020  . Encounter for elective induction of labor 07/05/2020  . GERD (gastroesophageal reflux disease) 08/06/2011  . Epigastric pain 08/06/2011   Additional problems:none   Date of discharge: 07/08/2020   Discharge diagnosis: Principal Problem:   Postpartum care following VAVD (2/2) Active Problems:   Encounter for elective induction of labor   Vacuum-assisted vaginal delivery   Second degree perineal laceration   Hypertension affecting pregnancy                                                              Post partum procedures:rhogam  Augmentation: AROM and Cytotec Pain control: Epidural  Laceration:2nd degree  Episiotomy:None  Complications: None  Hospital course:  Induction of Labor With Vaginal Delivery   35 y.o. yo G2P1011 at [redacted]w[redacted]d was admitted to the hospital 07/05/2020 for induction of labor.  Indication for induction: Gestational hypertension.  Patient had an uncomplicated labor course as follows: Membrane Rupture Time/Date: 8:07 AM ,07/06/2020   Delivery Method:Vaginal, Vacuum (Extractor)  Episiotomy: None  Lacerations:  2nd degree  Details of delivery can be found in separate delivery note.  Patient had a routine postpartum course. Procardia 30 XL started for continued elevated BP postpartum on day 1, BP in normotensive range thereafter. Preeclamptic labs were normal and no neural symptoms reported by patient. Patient is discharged home 07/08/20. Close follow  up in office in 1 week for BP check and preeclamptic precautions reinforced with patient.   Newborn Data: Birth date:07/06/2020  Birth time:10:15 PM  Gender:Female  Living status:Living  Apgars:7 ,9  Weight:3170 g   Physical exam  Vitals:   07/07/20 2014 07/08/20 0004 07/08/20 0614 07/08/20 0825  BP: (!) 134/91 139/86 117/78 133/88  Pulse: 92 95 86 64  Resp: 18 18 18 18   Temp: 98 F (36.7 C) 98 F (36.7 C) 98 F (36.7 C) 98.3 F (36.8 C)  TempSrc: Oral Oral Oral Oral  SpO2: 99%  100% 100%  Weight:      Height:       General: alert, cooperative and no distress Lochia: appropriate Uterine Fundus: firm Incision: N/A Perineum: repair intact, no edema DVT Evaluation: No significant calf/ankle edema. Labs: Lab Results  Component Value Date   WBC 18.3 (H) 07/07/2020   HGB 8.7 (L) 07/07/2020   HCT 28.6 (L) 07/07/2020   MCV 82.9 07/07/2020   PLT 251 07/07/2020   CMP Latest Ref Rng & Units 07/05/2020  Glucose 70 - 99 mg/dL 92  BUN 6 - 20 mg/dL 7  Creatinine 0.44 - 1.00 mg/dL 0.46  Sodium 135 - 145 mmol/L 135  Potassium 3.5 - 5.1 mmol/L 3.5  Chloride 98 - 111 mmol/L 107  CO2 22 - 32 mmol/L 19(L)  Calcium 8.9 - 10.3 mg/dL 8.9  Total Protein 6.5 - 8.1 g/dL 6.0(L)  Total Bilirubin 0.3 - 1.2 mg/dL 0.5  Alkaline Phos 38 - 126 U/L 190(H)  AST 15 - 41 U/L 21  ALT 0 - 44 U/L 16   Edinburgh Postnatal Depression Scale Screening Tool 07/07/2020  I have been able to laugh and see the funny side of things. 0  I have looked forward with enjoyment to things. 0  I have blamed myself unnecessarily when things went wrong. 0  I have been anxious or worried for no good reason. 0  I have felt scared or panicky for no good reason. 0  Things have been getting on top of me. 0  I have been so unhappy that I have had difficulty sleeping. 0  I have felt sad or miserable. 0  I have been so unhappy that I have been crying. 0  The thought of harming myself has occurred to me. 0  Edinburgh  Postnatal Depression Scale Total 0   Vaccines: TDaP          UTD         Flu             UTD                    COVID-19 UTD  Discharge instruction:  per After Visit Summary,  [facility] booklet and  "Understanding Mother & Baby Care" hospital booklet  After Visit Meds:  Allergies as of 07/08/2020      Reactions   Sulfa Antibiotics Rash      Medication List    TAKE these medications   acetaminophen 500 MG tablet Commonly known as: TYLENOL Take 2 tablets (1,000 mg total) by mouth every 6 (six) hours.   benzocaine-Menthol 20-0.5 % Aero Commonly known as: DERMOPLAST Apply 1 application topically as needed for irritation (perineal discomfort).   coconut oil Oil Apply 1 application topically as needed.   famotidine 20 MG tablet Commonly known as: PEPCID Take 20 mg by mouth daily.   ibuprofen 600 MG tablet Commonly known as: ADVIL Take 1 tablet (600 mg total) by mouth every 6 (six) hours.   iron polysaccharides 150 MG capsule Commonly known as: Ferrex 150 Take 1 capsule (150 mg total) by mouth daily.   Magnesium Oxide 400 (240 Mg) MG Tabs Take 1 tablet (400 mg total) by mouth daily. For prevention of constipation.   NIFEdipine 30 MG 24 hr tablet Commonly known as: ADALAT CC Take 1 tablet (30 mg total) by mouth daily.   pantoprazole 20 MG tablet Commonly known as: PROTONIX Take 40 mg by mouth daily.   prenatal multivitamin Tabs tablet Take 1 tablet by mouth daily at 12 noon.            Discharge Care Instructions  (From admission, onward)         Start     Ordered   07/08/20 0000  Discharge wound care:       Comments: Sitz baths 2 times /day with warm water x 1 week. May add herbals: 1 ounce dried comfrey leaf* 1 ounce calendula flowers 1 ounce lavender flowers  Supplies can be found online at [employer] sources at [employer], [employer]  1/2 ounce dried uva ursi leaves 1/2 ounce witch hazel blossoms (if you can find them) 1/2 ounce  dried sage leaf 1/2 cup sea salt Directions: Bring 2 quarts of water to a boil. Turn off heat, and place 1 ounce (approximately 1 large handful) of the above mixed herbs (not the salt) into the pot. Steep,  covered, for 30 minutes.  Strain the liquid well with a fine mesh strainer, and discard the herb material. Add 2 quarts of liquid to the tub, along with the 1/2 cup of salt. This medicinal liquid can also be made into compresses and peri-rinses.   07/08/20 1057          Diet: iron rich diet  Activity: Advance as tolerated. Pelvic rest for 6 weeks.   Postpartum contraception: Not Discussed  Newborn Data: Live born female  Birth Weight: 6 lb 15.8 oz (3170 g) APGAR: 7, 9  Newborn Delivery   Birth date/time: 07/06/2020 22:15:00 Delivery type: Vaginal, Vacuum (Extractor)      named Dossie Der Feeding: Breast Disposition:home with mother   Delivery Report:  Review the Delivery Report for details.    Follow up:  Follow-up Information    Brien Few, MD. Schedule an appointment as soon as possible for a visit in 1 week(s).   Specialty: Obstetrics and Gynecology Why: BP check Contact information: [place] [address] [phone]                 Signed: Juliene Pina, CNM, MSN 07/08/2020, 10:58 AM

## 2020-07-08 NOTE — Discharge Instructions (Signed)
Lactation outpatient support - home visit  Scherry Ran RN, MHA, IBCLC at [employer]: Lactation Consultant  [email or web address] Mail: [email or web address] Tel: [phone]    Additional resources:  [facility] [email or web address]   Chiropractic specialist   Dr. Marja Kays [email or web address]  Craniosacral therapy for baby  Roylene Reason  [email or web address]

## 2020-07-08 NOTE — Progress Notes (Signed)
Discharge instructions given to patient. Discussed follow up appointment, medications changes, signs and symptoms of hypertension.  Patient verbalized understanding.

## 2020-07-14 DIAGNOSIS — O135 Gestational [pregnancy-induced] hypertension without significant proteinuria, complicating the puerperium: Secondary | ICD-10-CM | POA: Diagnosis not present

## 2020-07-22 DIAGNOSIS — R030 Elevated blood-pressure reading, without diagnosis of hypertension: Secondary | ICD-10-CM | POA: Diagnosis not present

## 2020-08-12 DIAGNOSIS — Z3482 Encounter for supervision of other normal pregnancy, second trimester: Secondary | ICD-10-CM | POA: Diagnosis not present

## 2020-08-12 DIAGNOSIS — Z3483 Encounter for supervision of other normal pregnancy, third trimester: Secondary | ICD-10-CM | POA: Diagnosis not present

## 2020-08-17 DIAGNOSIS — Z124 Encounter for screening for malignant neoplasm of cervix: Secondary | ICD-10-CM | POA: Diagnosis not present

## 2020-08-31 DIAGNOSIS — R030 Elevated blood-pressure reading, without diagnosis of hypertension: Secondary | ICD-10-CM | POA: Diagnosis not present

## 2020-08-31 DIAGNOSIS — K2100 Gastro-esophageal reflux disease with esophagitis, without bleeding: Secondary | ICD-10-CM | POA: Diagnosis not present

## 2020-08-31 DIAGNOSIS — J302 Other seasonal allergic rhinitis: Secondary | ICD-10-CM | POA: Diagnosis not present

## 2020-08-31 DIAGNOSIS — F902 Attention-deficit hyperactivity disorder, combined type: Secondary | ICD-10-CM | POA: Diagnosis not present

## 2020-08-31 DIAGNOSIS — R635 Abnormal weight gain: Secondary | ICD-10-CM | POA: Diagnosis not present

## 2021-02-01 ENCOUNTER — Other Ambulatory Visit (HOSPITAL_BASED_OUTPATIENT_CLINIC_OR_DEPARTMENT_OTHER): Payer: Self-pay

## 2021-02-01 DIAGNOSIS — E669 Obesity, unspecified: Secondary | ICD-10-CM | POA: Diagnosis not present

## 2021-02-01 DIAGNOSIS — R5383 Other fatigue: Secondary | ICD-10-CM | POA: Diagnosis not present

## 2021-02-01 DIAGNOSIS — K219 Gastro-esophageal reflux disease without esophagitis: Secondary | ICD-10-CM | POA: Diagnosis not present

## 2021-02-01 DIAGNOSIS — Z0000 Encounter for general adult medical examination without abnormal findings: Secondary | ICD-10-CM | POA: Diagnosis not present

## 2021-02-01 MED ORDER — SEMAGLUTIDE-WEIGHT MANAGEMENT 0.25 MG/0.5ML ~~LOC~~ SOAJ
SUBCUTANEOUS | 0 refills | Status: DC
  Filled 2022-01-19: qty 2, fill #0

## 2021-03-20 DIAGNOSIS — U071 COVID-19: Secondary | ICD-10-CM | POA: Diagnosis not present

## 2021-07-11 ENCOUNTER — Ambulatory Visit
Admission: EM | Admit: 2021-07-11 | Discharge: 2021-07-11 | Disposition: A | Discharge status: Home / Self Care | Payer: Self-pay | Attending: Family Medicine | Admitting: Family Medicine

## 2021-07-11 ENCOUNTER — Other Ambulatory Visit: Payer: Self-pay

## 2021-07-11 ENCOUNTER — Ambulatory Visit: Payer: 59

## 2021-07-11 DIAGNOSIS — J029 Acute pharyngitis, unspecified: Secondary | ICD-10-CM | POA: Insufficient documentation

## 2021-07-11 DIAGNOSIS — J069 Acute upper respiratory infection, unspecified: Secondary | ICD-10-CM | POA: Insufficient documentation

## 2021-07-11 LAB — POCT RAPID STREP A (OFFICE): Rapid Strep A Screen: NEGATIVE

## 2021-07-11 MED ORDER — LIDOCAINE VISCOUS HCL 2 % MT SOLN: 10.0000 mL | OROMUCOSAL | 0 refills | Status: DC | PRN

## 2021-07-11 NOTE — ED Triage Notes (Signed)
Pt presents with c/o sore throat that began yesterday , states it feels like strep

## 2021-07-11 NOTE — ED Provider Notes (Signed)
[facility]    CSN: [id number] Arrival date & time: 07/11/21  6440      History   Chief Complaint Chief Complaint  Patient presents with   Sore Throat    HPI Christina Golden is a 36 y.o. female.   Presenting today with 1 day history of sore throat, fatigue, malaise, headache, postnasal drip, congestion.  Denies cough, chest pain, shortness of breath, abdominal pain, fever, nausea vomiting or diarrhea.  So far took some NyQuil last night with minimal relief.  No known sick contacts recently.  States that she is concerned about strep throat.  No known pertinent chronic medical problems.   Past Medical History:  Diagnosis Date   ADHD    GERD (gastroesophageal reflux disease)    Hiatal hernia     Patient Active Problem List   Diagnosis Date Noted   Vacuum-assisted vaginal delivery 07/07/2020   Postpartum care following VAVD (2/2) 07/07/2020   Second degree perineal laceration 07/07/2020   Hypertension affecting pregnancy 07/07/2020   Encounter for elective induction of labor 07/05/2020   GERD (gastroesophageal reflux disease) 08/06/2011   Epigastric pain 08/06/2011    Past Surgical History:  Procedure Laterality Date   DILATION AND EVACUATION N/A 02/26/2019   Procedure: DILATATION AND EVACUATION;  Surgeon: Brien Few, MD;  Location: [place];  Service: Gynecology;  Laterality: N/A;   ESOPHAGOGASTRODUODENOSCOPY (EGD) WITH PROPOFOL  09-15-2004  dr Gala Romney   OPERATIVE ULTRASOUND N/A 02/26/2019   Procedure: OPERATIVE ULTRASOUND;  Surgeon: Brien Few, MD;  Location: [place];  Service: Gynecology;  Laterality: N/A;   WISDOM TOOTH EXTRACTION  2004    OB History     Gravida  2   Para  1   Term  1   Preterm      AB  1   Living  1      SAB  1   IAB      Ectopic      Multiple  0   Live Births  1            Home Medications    Prior to Admission medications   Medication Sig Start Date End Date  Taking? Authorizing Provider  lidocaine (XYLOCAINE) 2 % solution Use as directed 10 mLs in the mouth or throat every 3 (three) hours as needed for mouth pain. 07/11/21  Yes Volney American, PA-C  acetaminophen (TYLENOL) 500 MG tablet Take 2 tablets (1,000 mg total) by mouth every 6 (six) hours. 07/08/20   Juliene Pina, CNM  benzocaine-Menthol (DERMOPLAST) 20-0.5 % AERO Apply 1 application topically as needed for irritation (perineal discomfort). 07/08/20   Juliene Pina, CNM  coconut oil OIL Apply 1 application topically as needed. 07/08/20   Juliene Pina, CNM  famotidine (PEPCID) 20 MG tablet Take 20 mg by mouth daily.    [provider]  ibuprofen (ADVIL) 600 MG tablet Take 1 tablet (600 mg total) by mouth every 6 (six) hours. 07/08/20   Juliene Pina, CNM  iron polysaccharides (FERREX 150) 150 MG capsule Take 1 capsule (150 mg total) by mouth daily. 07/08/20   Juliene Pina, CNM  Magnesium Oxide 400 (240 Mg) MG TABS Take 1 tablet (400 mg total) by mouth daily. For prevention of constipation. 07/08/20   Juliene Pina, CNM  NIFEdipine (ADALAT CC) 30 MG 24 hr tablet Take 1 tablet (30 mg total) by mouth daily. 07/08/20   Juliene Pina, CNM  pantoprazole (PROTONIX) 20 MG tablet Take 40 mg by mouth daily.    [provider]  Prenatal Vit-Fe Fumarate-FA (PRENATAL MULTIVITAMIN) TABS tablet Take 1 tablet by mouth daily at 12 noon.    [provider]  Semaglutide-Weight Management 0.25 MG/0.5ML SOAJ Inject 0.25mg  into into the skin every 7 days for 4 weeks 02/01/21     norethindrone-ethinyl estradiol (JUNEL FE,GILDESS FE,LOESTRIN FE) 1-20 MG-MCG tablet Take 1 tablet by mouth daily.  02/10/19  [provider]    Family History Family History  Problem Relation Age of Onset   Hypertension Mother    Hypertension Father    Hyperlipidemia Father    Healthy Sister    Healthy Brother     Social History Social History   Tobacco Use   Smoking status: Never    Smokeless tobacco: Never  Vaping Use   Vaping Use: Never used  Substance Use Topics   Alcohol use: Not Currently    Comment: Social   Drug use: No     Allergies   Sulfa antibiotics   Review of Systems Review of Systems Per HPI  Physical Exam Triage Vital Signs ED Triage Vitals [07/11/21 0822]  Enc Vitals Group     BP 136/88     Pulse Rate (!) 119     Resp 18     Temp 97.7 F (36.5 C)     Temp Source Oral     SpO2 99 %     Weight      Height      Head Circumference      Peak Flow      Pain Score      Pain Loc      Pain Edu?      Excl. in [place]?    No data found.  Updated Vital Signs BP 136/88 (BP Location: Right Arm)    Pulse (!) 119    Temp 97.7 F (36.5 C) (Oral)    Resp 18    SpO2 99%   Visual Acuity Right Eye Distance:   Left Eye Distance:   Bilateral Distance:    Right Eye Near:   Left Eye Near:    Bilateral Near:     Physical Exam Vitals and nursing note reviewed.  Constitutional:      Appearance: Normal appearance.  HENT:     Head: Atraumatic.     Right Ear: Tympanic membrane and external ear normal.     Left Ear: Tympanic membrane and external ear normal.     Nose: Rhinorrhea present.     Mouth/Throat:     Mouth: Mucous membranes are moist.     Pharynx: Posterior oropharyngeal erythema present. No oropharyngeal exudate.     Comments: No tonsillar edema, exudates.  Uvula midline, oral airway patent Eyes:     Extraocular Movements: Extraocular movements intact.     Conjunctiva/sclera: Conjunctivae normal.  Cardiovascular:     Rate and Rhythm: Normal rate and regular rhythm.     Heart sounds: Normal heart sounds.  Pulmonary:     Effort: Pulmonary effort is normal.     Breath sounds: Normal breath sounds. No wheezing.  Musculoskeletal:        General: Normal range of motion.     Cervical back: Normal range of motion and neck supple.  Lymphadenopathy:     Cervical: No cervical adenopathy.  Skin:    General: Skin is warm and dry.   Neurological:     Mental Status: She is alert  and oriented to person, place, and time.  Psychiatric:        Mood and Affect: Mood normal.        Thought Content: Thought content normal.     UC Treatments / Results  Labs (all labs ordered are listed, but only abnormal results are displayed) Labs Reviewed  CULTURE, GROUP A STREP ([place])  COVID-19, FLU A+B NAA  POCT RAPID STREP A (OFFICE)    EKG   Radiology No results found.  Procedures Procedures (including critical care time)  Medications Ordered in UC Medications - No data to display  Initial Impression / Assessment and Plan / UC Course  I have reviewed the triage vital signs and the nursing notes.  Pertinent labs & imaging results that were available during my care of the patient were reviewed by me and considered in my medical decision making (see chart for details).     Mildly tachycardic in triage, otherwise vital signs benign and reassuring.  Exam overall reassuring and suspicious for a viral upper respiratory infection.  Rapid strep negative, throat culture and COVID and flu testing pending.  Treat symptomatically with viscous lidocaine, supportive over-the-counter medications and home care.  Work note given.  Return for acutely worsening symptoms.  Final Clinical Impressions(s) / UC Diagnoses   Final diagnoses:  Sore throat  Viral URI   Discharge Instructions   None    ED Prescriptions     Medication Sig Dispense Auth. Provider   lidocaine (XYLOCAINE) 2 % solution Use as directed 10 mLs in the mouth or throat every 3 (three) hours as needed for mouth pain. 100 mL Volney American, [place]      PDMP not reviewed this encounter.   Merrie Roof [place], [place] 07/11/21 [phone]

## 2021-07-12 LAB — COVID-19, FLU A+B NAA
Influenza A, NAA: NOT DETECTED
Influenza B, NAA: NOT DETECTED
SARS-CoV-2, NAA: NOT DETECTED

## 2021-07-14 LAB — CULTURE, GROUP A STREP (THRC)

## 2021-07-18 ENCOUNTER — Other Ambulatory Visit (HOSPITAL_COMMUNITY): Payer: Self-pay

## 2021-07-18 MED ORDER — WEGOVY 2.4 MG/0.75ML ~~LOC~~ SOAJ
SUBCUTANEOUS | 2 refills | Status: DC
  Filled 2021-07-18: qty 3, 28d supply, fill #0
  Filled 2021-08-28: qty 3, 28d supply, fill #1
  Filled 2022-01-19: qty 3, 28d supply, fill #2

## 2021-07-19 ENCOUNTER — Other Ambulatory Visit (HOSPITAL_COMMUNITY): Payer: Self-pay

## 2021-08-28 ENCOUNTER — Other Ambulatory Visit (HOSPITAL_COMMUNITY): Payer: Self-pay

## 2021-09-25 ENCOUNTER — Other Ambulatory Visit (HOSPITAL_COMMUNITY): Payer: Self-pay

## 2021-09-25 MED ORDER — WEGOVY 2.4 MG/0.75ML ~~LOC~~ SOAJ
SUBCUTANEOUS | 2 refills | Status: DC
  Filled 2021-09-25: qty 3, 28d supply, fill #0
  Filled 2021-10-24: qty 3, 28d supply, fill #1
  Filled 2021-11-21: qty 3, 28d supply, fill #2

## 2021-09-27 ENCOUNTER — Other Ambulatory Visit (HOSPITAL_COMMUNITY): Payer: Self-pay

## 2021-10-24 ENCOUNTER — Other Ambulatory Visit (HOSPITAL_COMMUNITY): Payer: Self-pay

## 2021-11-21 ENCOUNTER — Other Ambulatory Visit (HOSPITAL_COMMUNITY): Payer: Self-pay

## 2021-11-23 ENCOUNTER — Other Ambulatory Visit: Payer: Self-pay | Admitting: Family Medicine

## 2021-11-23 ENCOUNTER — Other Ambulatory Visit: Payer: No Typology Code available for payment source

## 2021-11-23 DIAGNOSIS — R102 Pelvic and perineal pain: Secondary | ICD-10-CM

## 2021-11-23 DIAGNOSIS — B3731 Acute candidiasis of vulva and vagina: Secondary | ICD-10-CM

## 2021-11-23 LAB — URINALYSIS, ROUTINE W REFLEX MICROSCOPIC
Bilirubin, UA: NEGATIVE
Glucose, UA: NEGATIVE
Ketones, UA: NEGATIVE
Leukocytes,UA: NEGATIVE
Nitrite, UA: NEGATIVE
Protein,UA: NEGATIVE
RBC, UA: NEGATIVE
Specific Gravity, UA: 1.01 (ref 1.005–1.030)
Urobilinogen, Ur: 0.2 mg/dL (ref 0.2–1.0)
pH, UA: 6 (ref 5.0–7.5)

## 2021-11-23 MED ORDER — FLUCONAZOLE 150 MG PO TABS: 150.0000 mg | ORAL_TABLET | Freq: Once | ORAL | 0 refills | Status: AC

## 2021-11-23 NOTE — Addendum Note (Signed)
Addended by: Loman Brooklyn on: 11/23/2021 11:51 AM   Modules accepted: Orders

## 2021-11-23 NOTE — Progress Notes (Addendum)
Patient reporting vaginal yeast infection. Rx'd Diflucan.  Patient is also having suprapubic abdominal pain. UA ordered.

## 2021-11-27 ENCOUNTER — Other Ambulatory Visit (HOSPITAL_COMMUNITY): Payer: Self-pay

## 2021-11-27 MED ORDER — WEGOVY 2.4 MG/0.75ML ~~LOC~~ SOAJ
SUBCUTANEOUS | 2 refills | Status: DC
  Filled 2021-11-27 – 2021-12-16 (×2): qty 3, 28d supply, fill #0
  Filled 2022-01-19: qty 3, 28d supply, fill #1

## 2021-12-16 ENCOUNTER — Other Ambulatory Visit (HOSPITAL_COMMUNITY): Payer: Self-pay

## 2021-12-18 ENCOUNTER — Other Ambulatory Visit (HOSPITAL_COMMUNITY): Payer: Self-pay

## 2022-01-19 ENCOUNTER — Other Ambulatory Visit (HOSPITAL_COMMUNITY): Payer: Self-pay

## 2022-02-02 ENCOUNTER — Encounter: Payer: Self-pay | Admitting: Emergency Medicine

## 2022-02-02 ENCOUNTER — Ambulatory Visit
Admission: EM | Admit: 2022-02-02 | Discharge: 2022-02-02 | Disposition: A | Discharge status: Home / Self Care | Payer: No Typology Code available for payment source | Attending: Family Medicine | Admitting: Family Medicine

## 2022-02-02 ENCOUNTER — Other Ambulatory Visit: Payer: Self-pay

## 2022-02-02 DIAGNOSIS — S61207A Unspecified open wound of left little finger without damage to nail, initial encounter: Secondary | ICD-10-CM | POA: Diagnosis not present

## 2022-02-02 DIAGNOSIS — T148XXA Other injury of unspecified body region, initial encounter: Secondary | ICD-10-CM

## 2022-02-02 MED ORDER — CHLORHEXIDINE GLUCONATE 4 % EX LIQD: Freq: Every day | CUTANEOUS | 0 refills | Status: DC | PRN

## 2022-02-02 MED ORDER — LIDOCAINE-EPINEPHRINE-TETRACAINE (LET) TOPICAL GEL
3.0000 mL | Freq: Once | TOPICAL | Status: AC
  Administered 2022-02-02: 3 mL via TOPICAL

## 2022-02-02 MED ORDER — MUPIROCIN 2 % EX OINT: 1.0000 | TOPICAL_OINTMENT | Freq: Two times a day (BID) | CUTANEOUS | 0 refills | Status: DC

## 2022-02-02 NOTE — ED Notes (Signed)
Bacitracin applied, telfa directly to site, secured with coban.   Site management, infection prevention education provided. Pt verbalized understanding.

## 2022-02-02 NOTE — Discharge Instructions (Signed)
Clean the area with Hibiclens and apply mupirocin ointment, pressure dressing with nonstick gauze daily.  Follow-up if the area worsens or is not improving

## 2022-02-02 NOTE — ED Triage Notes (Signed)
Pt reports was cutting potatoes tonight and reports sliced left tip of fifth digit. Pt reports pain is minimal but is unable to keep site from bleeding x1 hour.

## 2022-02-02 NOTE — ED Notes (Addendum)
Site cleaned with hibiclense. Bleeding still remains. PA aware and gave order to apply L.E.T. to site. Pt tolerating well.

## 2022-02-02 NOTE — ED Provider Notes (Signed)
[facility]    CSN: [id number] Arrival date & time: 02/02/22  1935      History   Chief Complaint Chief Complaint  Patient presents with   Laceration    HPI Christina Golden is a 36 y.o. female.   Patient presenting today with a laceration to the left pinky finger that occurred this evening while she was cutting potatoes on a mandolin.  She states the area has been bleeding for the past hour and she is unable to get it to stop with pressure dressings.  She denies numbness, tingling, decreased range of motion in the finger.  Last Tdap was last year per patient.    Past Medical History:  Diagnosis Date   ADHD    GERD (gastroesophageal reflux disease)    Hiatal hernia     Patient Active Problem List   Diagnosis Date Noted   Vacuum-assisted vaginal delivery 07/07/2020   Postpartum care following VAVD (2/2) 07/07/2020   Second degree perineal laceration 07/07/2020   Hypertension affecting pregnancy 07/07/2020   Encounter for elective induction of labor 07/05/2020   GERD (gastroesophageal reflux disease) 08/06/2011   Epigastric pain 08/06/2011    Past Surgical History:  Procedure Laterality Date   DILATION AND EVACUATION N/A 02/26/2019   Procedure: DILATATION AND EVACUATION;  Surgeon: Brien Few, MD;  Location: [place];  Service: Gynecology;  Laterality: N/A;   ESOPHAGOGASTRODUODENOSCOPY (EGD) WITH PROPOFOL  09-15-2004  dr Gala Romney   OPERATIVE ULTRASOUND N/A 02/26/2019   Procedure: OPERATIVE ULTRASOUND;  Surgeon: Brien Few, MD;  Location: [place];  Service: Gynecology;  Laterality: N/A;   WISDOM TOOTH EXTRACTION  2004    OB History     Gravida  2   Para  1   Term  1   Preterm      AB  1   Living  1      SAB  1   IAB      Ectopic      Multiple  0   Live Births  1            Home Medications    Prior to Admission medications   Medication Sig Start Date End Date Taking? Authorizing  Provider  chlorhexidine (HIBICLENS) 4 % external liquid Apply topically daily as needed. 02/02/22  Yes Volney American, PA-C  mupirocin ointment (BACTROBAN) 2 % Apply 1 Application topically 2 (two) times daily. 02/02/22  Yes Volney American, PA-C  acetaminophen (TYLENOL) 500 MG tablet Take 2 tablets (1,000 mg total) by mouth every 6 (six) hours. 07/08/20   Juliene Pina, CNM  benzocaine-Menthol (DERMOPLAST) 20-0.5 % AERO Apply 1 application topically as needed for irritation (perineal discomfort). 07/08/20   Juliene Pina, CNM  coconut oil OIL Apply 1 application topically as needed. 07/08/20   Juliene Pina, CNM  famotidine (PEPCID) 20 MG tablet Take 20 mg by mouth daily.    [provider]  ibuprofen (ADVIL) 600 MG tablet Take 1 tablet (600 mg total) by mouth every 6 (six) hours. 07/08/20   Juliene Pina, CNM  iron polysaccharides (FERREX 150) 150 MG capsule Take 1 capsule (150 mg total) by mouth daily. 07/08/20   Juliene Pina, CNM  lidocaine (XYLOCAINE) 2 % solution Use as directed 10 mLs in the mouth or throat every 3 (three) hours as needed for mouth pain. 07/11/21   Volney American, PA-C  Magnesium Oxide 400 (240 Mg) MG TABS Take  1 tablet (400 mg total) by mouth daily. For prevention of constipation. 07/08/20   Juliene Pina, CNM  NIFEdipine (ADALAT CC) 30 MG 24 hr tablet Take 1 tablet (30 mg total) by mouth daily. 07/08/20   Juliene Pina, CNM  pantoprazole (PROTONIX) 20 MG tablet Take 40 mg by mouth daily.    [provider]  Prenatal Vit-Fe Fumarate-FA (PRENATAL MULTIVITAMIN) TABS tablet Take 1 tablet by mouth daily at 12 noon.    [provider]  Semaglutide-Weight Management (WEGOVY) 2.4 MG/0.75ML SOAJ Inject 0.75 mLs (2.4 mg total) into the skin every 7 days. 07/18/21     Semaglutide-Weight Management (WEGOVY) 2.4 MG/0.75ML SOAJ Inject 2.4 mg (1 pen) into the skin every 7 days. 09/25/21     Semaglutide-Weight Management (WEGOVY) 2.4 MG/0.75ML SOAJ  Inject 0.75 mLs (2.4 mg total) into the skin every 7 days. 11/27/21     norethindrone-ethinyl estradiol (JUNEL FE,GILDESS FE,LOESTRIN FE) 1-20 MG-MCG tablet Take 1 tablet by mouth daily.  02/10/19  [provider]    Family History Family History  Problem Relation Age of Onset   Hypertension Mother    Hypertension Father    Hyperlipidemia Father    Healthy Sister    Healthy Brother     Social History Social History   Tobacco Use   Smoking status: Never   Smokeless tobacco: Never  Vaping Use   Vaping Use: Never used  Substance Use Topics   Alcohol use: Not Currently    Comment: Social   Drug use: No     Allergies   Sulfa antibiotics   Review of Systems Review of Systems Per HPI  Physical Exam Triage Vital Signs ED Triage Vitals  Enc Vitals Group     BP 02/02/22 1949 (!) 136/90     Pulse Rate 02/02/22 1949 87     Resp 02/02/22 1949 20     Temp 02/02/22 1949 98.2 F (36.8 C)     Temp Source 02/02/22 1949 Oral     SpO2 02/02/22 1949 99 %     Weight --      Height --      Head Circumference --      Peak Flow --      Pain Score 02/02/22 1950 0     Pain Loc --      Pain Edu? --      Excl. in [place]? --    No data found.  Updated Vital Signs BP (!) 136/90 (BP Location: Right Arm)   Pulse 87   Temp 98.2 F (36.8 C) (Oral)   Resp 20   SpO2 99%   Visual Acuity Right Eye Distance:   Left Eye Distance:   Bilateral Distance:    Right Eye Near:   Left Eye Near:    Bilateral Near:     Physical Exam Vitals and nursing note reviewed.  Constitutional:      Appearance: Normal appearance. She is not ill-appearing.  HENT:     Head: Atraumatic.  Eyes:     Extraocular Movements: Extraocular movements intact.     Conjunctiva/sclera: Conjunctivae normal.  Cardiovascular:     Rate and Rhythm: Normal rate and regular rhythm.     Heart sounds: Normal heart sounds.  Pulmonary:     Effort: Pulmonary effort is normal.     Breath sounds: Normal breath  sounds.  Musculoskeletal:        General: Normal range of motion.     Cervical back: Normal range  of motion and neck supple.     Comments: Range of motion of the left pinky finger intact, no significant swelling  Skin:    General: Skin is warm.     Comments: Small oval-shaped avulsion to the pad of her left pinky finger, mild bleeding present  Neurological:     Mental Status: She is alert and oriented to person, place, and time.     Comments: Left hand neurovascularly intact  Psychiatric:        Mood and Affect: Mood normal.        Thought Content: Thought content normal.        Judgment: Judgment normal.      UC Treatments / Results  Labs (all labs ordered are listed, but only abnormal results are displayed) Labs Reviewed - No data to display  EKG   Radiology No results found.  Procedures Procedures (including critical care time)  Medications Ordered in UC Medications  lidocaine-EPINEPHrine-tetracaine (LET) topical gel (3 mLs Topical Given 02/02/22 1950)    Initial Impression / Assessment and Plan / UC Course  I have reviewed the triage vital signs and the nursing notes.  Pertinent labs & imaging results that were available during my care of the patient were reviewed by me and considered in my medical decision making (see chart for details).     Let gel applied in clinic to help slow bleeding, still bleeding very slightly at time of discharge so pressure dressing applied with antibiotic ointment.  Discussed home wound care with Hibiclens, mupirocin and continue pressure dressings.  Elevate at rest, over-the-counter pain relievers as needed.  Return for any worsening symptoms.  Tdap up-to-date.  Final Clinical Impressions(s) / UC Diagnoses   Final diagnoses:  Skin avulsion     Discharge Instructions      Clean the area with Hibiclens and apply mupirocin ointment, pressure dressing with nonstick gauze daily.  Follow-up if the area worsens or is not  improving    ED Prescriptions     Medication Sig Dispense Auth. Provider   chlorhexidine (HIBICLENS) 4 % external liquid Apply topically daily as needed. 120 mL Volney American, PA-C   mupirocin ointment (BACTROBAN) 2 % Apply 1 Application topically 2 (two) times daily. 22 g Volney American, [place]      PDMP not reviewed this encounter.   Volney American, [place] 02/02/22 2011

## 2022-06-05 ENCOUNTER — Encounter: Payer: Self-pay | Admitting: Family

## 2022-06-05 ENCOUNTER — Ambulatory Visit (INDEPENDENT_AMBULATORY_CARE_PROVIDER_SITE_OTHER): Payer: 59 | Admitting: Family

## 2022-06-05 VITALS — BP 149/95 | HR 93 | Temp 97.7°F

## 2022-06-05 DIAGNOSIS — J069 Acute upper respiratory infection, unspecified: Secondary | ICD-10-CM | POA: Diagnosis not present

## 2022-06-05 DIAGNOSIS — J029 Acute pharyngitis, unspecified: Secondary | ICD-10-CM

## 2022-06-05 LAB — RAPID STREP SCREEN (MED CTR MEBANE ONLY): Strep Gp A Ag, IA W/Reflex: NEGATIVE

## 2022-06-05 LAB — CULTURE, GROUP A STREP

## 2022-06-05 MED ORDER — AZITHROMYCIN 250 MG PO TABS: ORAL_TABLET | ORAL | 0 refills | Status: DC

## 2022-06-05 MED ORDER — PREDNISONE 10 MG (21) PO TBPK: ORAL_TABLET | ORAL | 0 refills | Status: DC

## 2022-06-05 NOTE — Progress Notes (Signed)
Subjective:    Patient ID: Christina Golden, female    DOB: [birth year], 37 y.o.   MRN: [id number]  No chief complaint on file.  PT presents to the office today with sore throat and sinus congestion.  Sore Throat  This is a new problem. The current episode started in the past 7 days. The problem has been waxing and waning. There has been no fever. The pain is at a severity of 2/10. The pain is mild. Associated symptoms include congestion, ear pain, headaches, swollen glands and trouble swallowing. Pertinent negatives include no coughing. She has had no exposure to strep. She has tried acetaminophen for the symptoms. The treatment provided mild relief.      Review of Systems  HENT:  Positive for congestion, ear pain and trouble swallowing.   Respiratory:  Negative for cough.   Neurological:  Positive for headaches.  All other systems reviewed and are negative.      Objective:   Physical Exam Vitals reviewed.  Constitutional:      General: She is not in acute distress.    Appearance: She is well-developed.  HENT:     Head: Normocephalic and atraumatic.     Right Ear: Tympanic membrane normal.     Left Ear: Tympanic membrane normal.     Mouth/Throat:     Pharynx: Posterior oropharyngeal erythema present.  Eyes:     Pupils: Pupils are equal, round, and reactive to light.  Neck:     Thyroid: No thyromegaly.  Cardiovascular:     Rate and Rhythm: Normal rate and regular rhythm.     Heart sounds: Normal heart sounds. No murmur heard. Pulmonary:     Effort: Pulmonary effort is normal. No respiratory distress.     Breath sounds: Normal breath sounds. No wheezing.  Abdominal:     General: Bowel sounds are normal. There is no distension.     Palpations: Abdomen is soft.     Tenderness: There is no abdominal tenderness.  Musculoskeletal:        General: No tenderness. Normal range of motion.     Cervical back: Normal range of motion and neck supple.  Skin:    General: Skin is  warm and dry.  Neurological:     Mental Status: She is alert and oriented to person, place, and time.     Cranial Nerves: No cranial nerve deficit.     Deep Tendon Reflexes: Reflexes are normal and symmetric.  Psychiatric:        Behavior: Behavior normal.        Thought Content: Thought content normal.        Judgment: Judgment normal.          BP (!) 148/103   Pulse 93   Temp 97.7 F (36.5 C) (Temporal)   SpO2 99%   Assessment & Plan:  Christina Golden comes in today with chief complaint of No chief complaint on file.   Diagnosis and orders addressed:  1. Viral URI - Take meds as prescribed - Use a cool mist humidifier  -Use saline nose sprays frequently -Force fluids -For any cough or congestion  Use plain Mucinex- regular strength or max strength is fine -For fever or aces or pains- take tylenol or ibuprofen. -Throat lozenges if help -Start prednisone, if symptoms worsen start zpak  - predniSONE (STERAPRED UNI-PAK 21 TAB) 10 MG (21) TBPK tablet; Use as directed  Dispense: 21 tablet; Refill: 0 - azithromycin (ZITHROMAX) 250 MG tablet; Take  500 mg once, then 250 mg for four days  Dispense: 6 tablet; Refill: 0  2. Sore throat - Rapid Strep Screen ([facility] ONLY)     Evelina Dun, FNP

## 2022-06-05 NOTE — Patient Instructions (Signed)

## 2022-06-07 NOTE — Addendum Note (Signed)
Addended by: Evelina Dun A on: 06/07/2022 09:56 AM   Modules accepted: Level of Service

## 2022-08-23 ENCOUNTER — Ambulatory Visit (INDEPENDENT_AMBULATORY_CARE_PROVIDER_SITE_OTHER): Payer: 59 | Admitting: Family

## 2022-08-23 ENCOUNTER — Encounter: Payer: Self-pay | Admitting: Family

## 2022-08-23 VITALS — BP 135/88 | HR 85 | Temp 98.0°F | Ht 67.0 in | Wt 144.0 lb

## 2022-08-23 DIAGNOSIS — H109 Unspecified conjunctivitis: Secondary | ICD-10-CM

## 2022-08-23 MED ORDER — POLYMYXIN B-TRIMETHOPRIM 10000-0.1 UNIT/ML-% OP SOLN: 1.0000 [drp] | Freq: Four times a day (QID) | OPHTHALMIC | 0 refills | Status: DC

## 2022-08-23 NOTE — Progress Notes (Signed)
   Subjective:    Patient ID: Christina Golden, female    DOB: Jul 08, 1985, 37 y.o.   MRN: [id number]  Chief Complaint  Patient presents with   Eye Problem   Pt presents to the office today with conjunctivitis yesterday.  Eye Problem  The right eye is affected. This is a new problem. The current episode started yesterday. The problem occurs constantly. The problem has been gradually worsening. There was no injury mechanism. The pain is at a severity of 2/10. The pain is mild. Associated symptoms include blurred vision, an eye discharge, eye redness, a foreign body sensation, itching and photophobia. Pertinent negatives include no fever. She has tried eye drops and commercial eye wash for the symptoms. The treatment provided mild relief.      Review of Systems  Constitutional:  Negative for fever.  Eyes:  Positive for blurred vision, photophobia, discharge, redness and itching.  All other systems reviewed and are negative.      Objective:   Physical Exam Vitals reviewed.  Constitutional:      General: She is not in acute distress.    Appearance: She is well-developed.  HENT:     Head: Normocephalic and atraumatic.  Eyes:     General:        Right eye: Discharge present.     Conjunctiva/sclera:     Right eye: Exudate present.     Pupils: Pupils are equal, round, and reactive to light.  Neck:     Thyroid: No thyromegaly.  Cardiovascular:     Rate and Rhythm: Normal rate and regular rhythm.     Heart sounds: Normal heart sounds. No murmur heard. Pulmonary:     Effort: Pulmonary effort is normal. No respiratory distress.     Breath sounds: Normal breath sounds. No wheezing.  Abdominal:     General: Bowel sounds are normal. There is no distension.     Palpations: Abdomen is soft.     Tenderness: There is no abdominal tenderness.  Musculoskeletal:        General: No tenderness. Normal range of motion.     Cervical back: Normal range of motion and neck supple.  Skin:     General: Skin is warm and dry.  Neurological:     Mental Status: She is alert and oriented to person, place, and time.     Cranial Nerves: No cranial nerve deficit.     Deep Tendon Reflexes: Reflexes are normal and symmetric.  Psychiatric:        Behavior: Behavior normal.        Thought Content: Thought content normal.        Judgment: Judgment normal.      BP 135/88   Pulse 85   Temp 98 F (36.7 C) (Temporal)   Ht 5\' 7"  (1.702 m)   Wt 144 lb (65.3 kg)   BMI 22.55 kg/m       Assessment & Plan:  Christina Golden comes in today with chief complaint of Eye Problem   Diagnosis and orders addressed:  1. Bacterial conjunctivitis Cool compresses Good hand hygiene  Avoid rubbing  Follow up if symptoms worsen or do not improve - trimethoprim-polymyxin b (POLYTRIM) ophthalmic solution; Place 1 drop into the left eye every 6 (six) hours.  Dispense: 10 mL; Refill: 0  Evelina Dun, FNP

## 2022-08-23 NOTE — Patient Instructions (Signed)
Bacterial Conjunctivitis, Adult Bacterial conjunctivitis is an infection of the clear membrane that covers the white part of the eye and the inner surface of the eyelid (conjunctiva). When the blood vessels in the conjunctiva become inflamed, the eye becomes red or pink. The eye often feels irritated or itchy. Bacterial conjunctivitis spreads easily from person to person (is contagious). It also spreads easily from one eye to the other eye. What are the causes? This condition is caused by bacteria. You may get the infection if you come into close contact with: A person who is infected with the bacteria. Items that are contaminated with the bacteria, such as a face towel, contact lens solution, or eye makeup. What increases the risk? You are more likely to develop this condition if: You are exposed to other people who have the infection. You wear contact lenses. You have a sinus infection. You have had a recent eye injury or surgery. You have a weak body defense system (immune system). You have a medical condition that causes dry eyes. What are the signs or symptoms? Symptoms of this condition include: Thick, yellowish discharge from the eye. This may turn into a crust on the eyelid overnight and cause your eyelids to stick together. Tearing or watery eyes. Itchy eyes. Burning feeling in your eyes. Eye redness. Swollen eyelids. Blurred vision. How is this diagnosed? This condition is diagnosed based on your symptoms and medical history. Your health care provider may also take a sample of discharge from your eye to find the cause of your infection. How is this treated? This condition may be treated with: Antibiotic eye drops or ointment to clear the infection more quickly and prevent the spread of infection to others. Antibiotic medicines taken by mouth (orally) to treat infections that do not respond to drops or ointments or that last longer than 10 days. Cool, wet cloths (cool  compresses) placed on the eyes. Artificial tears applied 2-6 times a day. Follow these instructions at home: Medicines Take or apply your antibiotic medicine as told by your health care provider. Do not stop using the antibiotic, even if your condition improves, unless directed by your health care provider. Take or apply over-the-counter and prescription medicines only as told by your health care provider. Be very careful to avoid touching the edge of your eyelid with the eye-drop bottle or the ointment tube when you apply medicines to the affected eye. This will keep you from spreading the infection to your other eye or to other people. Managing discomfort Gently wipe away any drainage from your eye with a warm, wet washcloth or a cotton ball. Apply a clean, cool compress to your eye for 10-20 minutes, 3-4 times a day. General instructions Do not wear contact lenses until the inflammation is gone and your health care provider says it is safe to wear them again. Ask your health care provider how to sterilize or replace your contact lenses before you use them again. Wear glasses until you can resume wearing contact lenses. Avoid wearing eye makeup until the inflammation is gone. Throw away any old eye cosmetics that may be contaminated. Change or wash your pillowcase every day. Do not share towels or washcloths. This may spread the infection. Wash your hands often with soap and water for at least 20 seconds and especially before touching your face or eyes. Use paper towels to dry your hands. Avoid touching or rubbing your eyes. Do not drive or use heavy machinery if your vision is blurred. Contact   a health care provider if: You have a fever. Your symptoms do not get better after 10 days. Get help right away if: You have a fever and your symptoms suddenly get worse. You have severe pain when you move your eye. You have facial pain, redness, or swelling. You have a sudden loss of  vision. Summary Bacterial conjunctivitis is an infection of the clear membrane that covers the white part of the eye and the inner surface of the eyelid (conjunctiva). Bacterial conjunctivitis spreads easily from eye to eye and from person to person (is contagious). Wash your hands often with soap and water for at least 20 seconds and especially before touching your face or eyes. Use paper towels to dry your hands. Take or apply your antibiotic medicine as told by your health care provider. Do not stop using the antibiotic even if your condition improves. Contact a health care provider if you have a fever or if your symptoms do not get better after 10 days. Get help right away if you have a sudden loss of vision. This information is not intended to replace advice given to you by your health care provider. Make sure you discuss any questions you have with your health care provider. Document Revised: 08/31/2020 Document Reviewed: 08/31/2020 Elsevier Patient Education  2023 [employer]  

## 2022-10-12 ENCOUNTER — Encounter: Payer: Self-pay | Admitting: Family

## 2022-10-12 ENCOUNTER — Other Ambulatory Visit: Payer: Self-pay | Admitting: Family Medicine

## 2022-10-12 ENCOUNTER — Ambulatory Visit (INDEPENDENT_AMBULATORY_CARE_PROVIDER_SITE_OTHER): Payer: 59 | Admitting: Family

## 2022-10-12 VITALS — BP 128/83 | HR 109 | Temp 98.9°F | Ht 67.0 in | Wt 142.0 lb

## 2022-10-12 DIAGNOSIS — J069 Acute upper respiratory infection, unspecified: Secondary | ICD-10-CM | POA: Diagnosis not present

## 2022-10-12 DIAGNOSIS — H6993 Unspecified Eustachian tube disorder, bilateral: Secondary | ICD-10-CM

## 2022-10-12 MED ORDER — PREDNISONE 10 MG (21) PO TBPK: ORAL_TABLET | ORAL | 0 refills | Status: DC

## 2022-10-12 NOTE — Patient Instructions (Signed)

## 2022-10-12 NOTE — Progress Notes (Signed)
Subjective:    Patient ID: Christina Golden, female    DOB: 1985-09-28, 37 y.o.   MRN: [id number]  Chief Complaint  Patient presents with   URI   . URI  This is a new problem. The current episode started in the past 7 days. The problem has been gradually worsening. There has been no fever. Associated symptoms include congestion, headaches, rhinorrhea, sinus pain, sneezing and a sore throat. Pertinent negatives include no coughing or wheezing. Ear pain: ear fullness.She has tried decongestant and antihistamine for the symptoms.      Review of Systems  HENT:  Positive for congestion, rhinorrhea, sinus pain, sneezing and sore throat. Ear pain: ear fullness.  Respiratory:  Negative for cough and wheezing.   Neurological:  Positive for headaches.  All other systems reviewed and are negative.      Objective:   Physical Exam Vitals reviewed.  Constitutional:      General: She is not in acute distress.    Appearance: She is well-developed.  HENT:     Head: Normocephalic and atraumatic.     Right Ear: A middle ear effusion is present.     Left Ear: A middle ear effusion is present.  Eyes:     Pupils: Pupils are equal, round, and reactive to light.  Neck:     Thyroid: No thyromegaly.  Cardiovascular:     Rate and Rhythm: Normal rate and regular rhythm.     Heart sounds: Normal heart sounds. No murmur heard. Pulmonary:     Effort: Pulmonary effort is normal. No respiratory distress.     Breath sounds: Normal breath sounds. No wheezing.  Abdominal:     General: Bowel sounds are normal. There is no distension.     Palpations: Abdomen is soft.     Tenderness: There is no abdominal tenderness.  Musculoskeletal:        General: No tenderness. Normal range of motion.     Cervical back: Normal range of motion and neck supple.  Skin:    General: Skin is warm and dry.  Neurological:     Mental Status: She is alert and oriented to person, place, and time.     Cranial Nerves: No  cranial nerve deficit.     Deep Tendon Reflexes: Reflexes are normal and symmetric.  Psychiatric:        Behavior: Behavior normal.        Thought Content: Thought content normal.        Judgment: Judgment normal.     BP 128/83   Pulse (!) 109   Temp 98.9 F (37.2 C) (Temporal)   Ht 5\' 7"  (1.702 m)   Wt 142 lb (64.4 kg)   SpO2 97%   BMI 22.24 kg/m      Assessment & Plan:  Christina Golden comes in today with chief complaint of URI   Diagnosis and orders addressed:  1. Viral URI - Take meds as prescribed - Use a cool mist humidifier  -Use saline nose sprays frequently -Force fluids -For any cough or congestion  Use plain Mucinex- regular strength or max strength is fine -For fever or aces or pains- take tylenol or ibuprofen. -Throat lozenges if helps -Follow up if symptoms worsen or do not improve  - predniSONE (STERAPRED UNI-PAK 21 TAB) 10 MG (21) TBPK tablet; Use as directed  Dispense: 21 tablet; Refill: 0  2. Eustachian tube dysfunction, bilateral Flonase and zyrtec, nasal decongestant  - predniSONE (STERAPRED UNI-PAK 21 TAB)  10 MG (21) TBPK tablet; Use as directed  Dispense: 21 tablet; Refill: 0   Jannifer Rodney, FNP

## 2022-10-18 ENCOUNTER — Other Ambulatory Visit: Payer: 59

## 2022-10-18 ENCOUNTER — Other Ambulatory Visit: Payer: Self-pay | Admitting: Nurse Practitioner

## 2022-10-18 DIAGNOSIS — W57XXXA Bitten or stung by nonvenomous insect and other nonvenomous arthropods, initial encounter: Secondary | ICD-10-CM | POA: Diagnosis not present

## 2022-10-18 DIAGNOSIS — S20362A Insect bite (nonvenomous) of left front wall of thorax, initial encounter: Secondary | ICD-10-CM | POA: Diagnosis not present

## 2022-10-18 MED ORDER — DOXYCYCLINE HYCLATE 100 MG PO TABS: 100.0000 mg | ORAL_TABLET | Freq: Two times a day (BID) | ORAL | 0 refills | Status: DC

## 2022-10-19 ENCOUNTER — Other Ambulatory Visit: Payer: 59

## 2022-10-20 LAB — LYME DISEASE SEROLOGY W/REFLEX: Lyme Total Antibody EIA: NEGATIVE

## 2022-12-31 ENCOUNTER — Other Ambulatory Visit: Payer: Self-pay | Admitting: Family

## 2022-12-31 DIAGNOSIS — J069 Acute upper respiratory infection, unspecified: Secondary | ICD-10-CM

## 2022-12-31 DIAGNOSIS — H6993 Unspecified Eustachian tube disorder, bilateral: Secondary | ICD-10-CM

## 2023-01-03 ENCOUNTER — Encounter: Payer: Self-pay | Admitting: Family

## 2023-01-03 ENCOUNTER — Ambulatory Visit (INDEPENDENT_AMBULATORY_CARE_PROVIDER_SITE_OTHER): Payer: 59 | Admitting: Family

## 2023-01-03 VITALS — BP 145/97 | HR 103 | Temp 97.3°F | Ht 67.0 in

## 2023-01-03 DIAGNOSIS — J209 Acute bronchitis, unspecified: Secondary | ICD-10-CM

## 2023-01-03 DIAGNOSIS — R030 Elevated blood-pressure reading, without diagnosis of hypertension: Secondary | ICD-10-CM | POA: Diagnosis not present

## 2023-01-03 MED ORDER — ALBUTEROL SULFATE HFA 108 (90 BASE) MCG/ACT IN AERS: 2.0000 | INHALATION_SPRAY | Freq: Four times a day (QID) | RESPIRATORY_TRACT | 0 refills | Status: DC | PRN

## 2023-01-03 MED ORDER — BENZONATATE 200 MG PO CAPS: 200.0000 mg | ORAL_CAPSULE | Freq: Three times a day (TID) | ORAL | 1 refills | Status: DC | PRN

## 2023-01-03 NOTE — Progress Notes (Signed)
Subjective:    Patient ID: Christina Golden, female    DOB: 1985/12/23, 37 y.o.   MRN: [id number]  No chief complaint on file.  Pt presents to the office today for cough that started three days ago.  Cough This is a new problem. The current episode started in the past 7 days. The problem has been resolved. The cough is Productive of purulent sputum. Associated symptoms include headaches, nasal congestion and wheezing. Pertinent negatives include no chills, ear congestion, ear pain, fever or shortness of breath. She has tried rest and OTC cough suppressant for the symptoms. The treatment provided mild relief. There is no history of asthma.      Review of Systems  Constitutional:  Negative for chills and fever.  HENT:  Negative for ear pain.   Respiratory:  Positive for cough and wheezing. Negative for shortness of breath.   Neurological:  Positive for headaches.  All other systems reviewed and are negative.      Objective:   Physical Exam Vitals reviewed.  Constitutional:      General: She is not in acute distress.    Appearance: She is well-developed.  HENT:     Head: Normocephalic and atraumatic.     Right Ear: Tympanic membrane normal.     Left Ear: Tympanic membrane normal.  Eyes:     Pupils: Pupils are equal, round, and reactive to light.  Neck:     Thyroid: No thyromegaly.  Cardiovascular:     Rate and Rhythm: Normal rate and regular rhythm.     Heart sounds: Normal heart sounds. No murmur heard. Pulmonary:     Effort: Pulmonary effort is normal. No respiratory distress.     Breath sounds: Normal breath sounds. No wheezing.  Abdominal:     General: Bowel sounds are normal. There is no distension.     Palpations: Abdomen is soft.     Tenderness: There is no abdominal tenderness.  Musculoskeletal:        General: No tenderness. Normal range of motion.     Cervical back: Normal range of motion and neck supple.  Skin:    General: Skin is warm and dry.   Neurological:     Mental Status: She is alert and oriented to person, place, and time.     Cranial Nerves: No cranial nerve deficit.     Deep Tendon Reflexes: Reflexes are normal and symmetric.  Psychiatric:        Behavior: Behavior normal.        Thought Content: Thought content normal.        Judgment: Judgment normal.        BP (!) 145/97   Pulse (!) 103   Temp (!) 97.3 F (36.3 C) (Temporal)   Ht 5\' 7"  (1.702 m)   SpO2 99%   BMI 22.24 kg/m   Assessment & Plan:   Christina Golden comes in today with chief complaint of No chief complaint on file.   Diagnosis and orders addressed:  1. Acute bronchitis, unspecified organism - Take meds as prescribed - Use a cool mist humidifier  -Use saline nose sprays frequently -Force fluids -For any cough or congestion  Use plain Mucinex- regular strength or max strength is fine -For fever or aces or pains- take tylenol or ibuprofen. -Throat lozenges if help -Tessalon as needed Follow up if symptoms worsen or do not improve  - albuterol (VENTOLIN HFA) 108 (90 Base) MCG/ACT inhaler; Inhale 2 puffs into the lungs  every 6 (six) hours as needed for wheezing or shortness of breath.  Dispense: 8 g; Refill: 0 - benzonatate (TESSALON) 200 MG capsule; Take 1 capsule (200 mg total) by mouth 3 (three) times daily as needed.  Dispense: 30 capsule; Refill: 1  2. Elevated blood pressure reading Pt will monitor BP at home and let us know if >140/90 Avoid decongestant    Jannifer Rodney, FNP

## 2023-01-04 MED ORDER — AZITHROMYCIN 250 MG PO TABS: ORAL_TABLET | ORAL | 0 refills | Status: DC

## 2023-01-04 NOTE — Addendum Note (Signed)
Addended by: Jannifer Rodney A on: 01/04/2023 02:17 PM   Modules accepted: Orders

## 2023-01-08 ENCOUNTER — Other Ambulatory Visit: Payer: Self-pay | Admitting: Family

## 2023-01-08 MED ORDER — PREDNISONE 10 MG (21) PO TBPK: ORAL_TABLET | ORAL | 0 refills | Status: DC

## 2023-01-08 NOTE — Progress Notes (Signed)
Continued cough, prednisone Prescription sent to pharmacy

## 2023-02-01 ENCOUNTER — Other Ambulatory Visit: Payer: Self-pay | Admitting: Family

## 2023-02-01 DIAGNOSIS — R399 Unspecified symptoms and signs involving the genitourinary system: Secondary | ICD-10-CM

## 2023-02-01 MED ORDER — CEPHALEXIN 500 MG PO CAPS: 500.0000 mg | ORAL_CAPSULE | Freq: Two times a day (BID) | ORAL | 0 refills | Status: DC

## 2023-02-01 MED ORDER — FLUCONAZOLE 150 MG PO TABS: 150.0000 mg | ORAL_TABLET | ORAL | 0 refills | Status: DC | PRN

## 2023-02-01 NOTE — Progress Notes (Signed)
Pt calls with UTI symptoms of frequency and dysuria. No fever or flank pain. Will send in diflucan as she gets yeast infections with antibiotics.   Jannifer Rodney, FNP

## 2023-02-20 DIAGNOSIS — Z01411 Encounter for gynecological examination (general) (routine) with abnormal findings: Secondary | ICD-10-CM | POA: Diagnosis not present

## 2023-02-20 DIAGNOSIS — Z124 Encounter for screening for malignant neoplasm of cervix: Secondary | ICD-10-CM | POA: Diagnosis not present

## 2023-02-20 DIAGNOSIS — Z113 Encounter for screening for infections with a predominantly sexual mode of transmission: Secondary | ICD-10-CM | POA: Diagnosis not present

## 2023-02-20 DIAGNOSIS — Z01419 Encounter for gynecological examination (general) (routine) without abnormal findings: Secondary | ICD-10-CM | POA: Diagnosis not present

## 2023-04-19 ENCOUNTER — Other Ambulatory Visit: Payer: 59

## 2023-04-19 DIAGNOSIS — Z7689 Persons encountering health services in other specified circumstances: Secondary | ICD-10-CM | POA: Diagnosis not present

## 2023-05-16 ENCOUNTER — Telehealth: Payer: 59 | Admitting: Family

## 2023-05-16 ENCOUNTER — Encounter: Payer: Self-pay | Admitting: Family

## 2023-05-16 DIAGNOSIS — R110 Nausea: Secondary | ICD-10-CM

## 2023-05-16 MED ORDER — ONDANSETRON 4 MG PO TBDP
4.0000 mg | ORAL_TABLET | Freq: Three times a day (TID) | ORAL | 2 refills | Status: DC | PRN
  Filled 2024-01-09: qty 20, 7d supply, fill #0

## 2023-05-16 NOTE — Progress Notes (Signed)
Virtual Visit Consent   Christina Golden, you are scheduled for a virtual visit with a [facility] provider today. Just as with appointments in the office, your consent must be obtained to participate. Your consent will be active for this visit and any virtual visit you may have with one of our providers in the next 365 days. If you have a MyChart account, a copy of this consent can be sent to you electronically.  As this is a virtual visit, video technology does not allow for your provider to perform a traditional examination. This may limit your provider's ability to fully assess your condition. If your provider identifies any concerns that need to be evaluated in person or the need to arrange testing (such as labs, EKG, etc.), we will make arrangements to do so. Although advances in technology are sophisticated, we cannot ensure that it will always work on either your end or our end. If the connection with a video visit is poor, the visit may have to be switched to a telephone visit. With either a video or telephone visit, we are not always able to ensure that we have a secure connection.  By engaging in this virtual visit, you consent to the provision of healthcare and authorize for your insurance to be billed (if applicable) for the services provided during this visit. Depending on your insurance coverage, you may receive a charge related to this service.  I need to obtain your verbal consent now. Are you willing to proceed with your visit today? Christina Golden has provided verbal consent on 05/16/2023 for a virtual visit (video or telephone). Jannifer Rodney, FNP  Date: 05/16/2023 2:37 PM  Virtual Visit via Video Note   I, Jannifer Rodney, connected with  Christina Golden  ([id number], 11-21-1985) on 05/16/23 at  2:40 PM EST by a video-enabled telemedicine application and verified that I am speaking with the correct person using two identifiers.  Location: Patient: Virtual Visit Location Patient:  Other: work Provider: Pharmacist, community: Home Office   I discussed the limitations of evaluation and management by telemedicine and the availability of in person appointments. The patient expressed understanding and agreed to proceed.    History of Present Illness: Christina Golden is a 37 y.o. who identifies as a female who was assigned female at birth, and is being seen today for nausea. She reports her family has had a GI bug the next the few days.   HPI: HPI  Problems:  Patient Active Problem List   Diagnosis Date Noted   Vacuum-assisted vaginal delivery 07/07/2020   Postpartum care following VAVD (2/2) 07/07/2020   Second degree perineal laceration 07/07/2020   Hypertension affecting pregnancy 07/07/2020   Encounter for elective induction of labor 07/05/2020   GERD (gastroesophageal reflux disease) 08/06/2011   Epigastric pain 08/06/2011    Allergies:  Allergies  Allergen Reactions   Sulfa Antibiotics Rash   Medications:  Current Outpatient Medications:    ondansetron (ZOFRAN-ODT) 4 MG disintegrating tablet, Take 1 tablet (4 mg total) by mouth every 8 (eight) hours as needed for nausea or vomiting., Disp: 20 tablet, Rfl: 2   acetaminophen (TYLENOL) 500 MG tablet, Take 2 tablets (1,000 mg total) by mouth every 6 (six) hours., Disp: 30 tablet, Rfl: 0   albuterol (VENTOLIN HFA) 108 (90 Base) MCG/ACT inhaler, Inhale 2 puffs into the lungs every 6 (six) hours as needed for wheezing or shortness of breath., Disp: 8 g, Rfl: 0   cephALEXin (KEFLEX)  500 MG capsule, Take 1 capsule (500 mg total) by mouth 2 (two) times daily., Disp: 14 capsule, Rfl: 0   fluconazole (DIFLUCAN) 150 MG tablet, Take 1 tablet (150 mg total) by mouth every three (3) days as needed., Disp: 3 tablet, Rfl: 0   pantoprazole (PROTONIX) 20 MG tablet, Take 40 mg by mouth daily., Disp: , Rfl:    Semaglutide-Weight Management (WEGOVY) 2.4 MG/0.75ML SOAJ, Inject 0.75 mLs (2.4 mg total) into the skin every 7  days., Disp: 3 mL, Rfl: 2  Observations/Objective: Patient is well-developed, well-nourished in no acute distress.  Resting comfortably  Head is normocephalic, atraumatic.  No labored breathing.  Speech is clear and coherent with logical content.  Patient is alert and oriented at baseline.    Assessment and Plan: 1. Nausea (Primary) - ondansetron (ZOFRAN-ODT) 4 MG disintegrating tablet; Take 1 tablet (4 mg total) by mouth every 8 (eight) hours as needed for nausea or vomiting.  Dispense: 20 tablet; Refill: 2  Zofran as needed  Bland diet Force fluids  Follow up if symptoms worsen or do not improve   Follow Up Instructions: I discussed the assessment and treatment plan with the patient. The patient was provided an opportunity to ask questions and all were answered. The patient agreed with the plan and demonstrated an understanding of the instructions.  A copy of instructions were sent to the patient via MyChart unless otherwise noted below.    The patient was advised to call back or seek an in-person evaluation if the symptoms worsen or if the condition fails to improve as anticipated.    Jannifer Rodney, FNP

## 2023-10-07 DIAGNOSIS — S134XXA Sprain of ligaments of cervical spine, initial encounter: Secondary | ICD-10-CM | POA: Diagnosis not present

## 2023-10-07 DIAGNOSIS — S338XXA Sprain of other parts of lumbar spine and pelvis, initial encounter: Secondary | ICD-10-CM | POA: Diagnosis not present

## 2023-10-07 DIAGNOSIS — S233XXA Sprain of ligaments of thoracic spine, initial encounter: Secondary | ICD-10-CM | POA: Diagnosis not present

## 2023-10-09 DIAGNOSIS — F902 Attention-deficit hyperactivity disorder, combined type: Secondary | ICD-10-CM | POA: Diagnosis not present

## 2023-10-09 DIAGNOSIS — R030 Elevated blood-pressure reading, without diagnosis of hypertension: Secondary | ICD-10-CM | POA: Diagnosis not present

## 2023-10-09 DIAGNOSIS — R000 Tachycardia, unspecified: Secondary | ICD-10-CM | POA: Diagnosis not present

## 2023-10-17 DIAGNOSIS — S233XXA Sprain of ligaments of thoracic spine, initial encounter: Secondary | ICD-10-CM | POA: Diagnosis not present

## 2023-10-17 DIAGNOSIS — S134XXA Sprain of ligaments of cervical spine, initial encounter: Secondary | ICD-10-CM | POA: Diagnosis not present

## 2023-10-17 DIAGNOSIS — S338XXA Sprain of other parts of lumbar spine and pelvis, initial encounter: Secondary | ICD-10-CM | POA: Diagnosis not present

## 2023-12-18 DIAGNOSIS — I10 Essential (primary) hypertension: Secondary | ICD-10-CM | POA: Diagnosis not present

## 2023-12-18 DIAGNOSIS — R000 Tachycardia, unspecified: Secondary | ICD-10-CM | POA: Diagnosis not present

## 2023-12-19 ENCOUNTER — Other Ambulatory Visit (HOSPITAL_COMMUNITY): Payer: Self-pay

## 2023-12-19 MED ORDER — AMPHETAMINE-DEXTROAMPHETAMINE 30 MG PO TABS
15.0000 mg | ORAL_TABLET | Freq: Two times a day (BID) | ORAL | 0 refills | Status: DC
  Filled 2023-12-26 (×3): qty 30, 30d supply, fill #0

## 2023-12-25 ENCOUNTER — Other Ambulatory Visit (HOSPITAL_COMMUNITY): Payer: Self-pay

## 2023-12-25 ENCOUNTER — Encounter (HOSPITAL_COMMUNITY): Payer: Self-pay

## 2023-12-25 DIAGNOSIS — R000 Tachycardia, unspecified: Secondary | ICD-10-CM | POA: Diagnosis not present

## 2023-12-26 ENCOUNTER — Other Ambulatory Visit (HOSPITAL_COMMUNITY): Payer: Self-pay

## 2023-12-26 ENCOUNTER — Other Ambulatory Visit (HOSPITAL_BASED_OUTPATIENT_CLINIC_OR_DEPARTMENT_OTHER): Payer: Self-pay

## 2023-12-26 ENCOUNTER — Other Ambulatory Visit: Payer: Self-pay

## 2023-12-27 DIAGNOSIS — R000 Tachycardia, unspecified: Secondary | ICD-10-CM | POA: Diagnosis not present

## 2023-12-30 ENCOUNTER — Other Ambulatory Visit (HOSPITAL_COMMUNITY): Payer: Self-pay

## 2023-12-30 ENCOUNTER — Other Ambulatory Visit: Payer: Self-pay

## 2023-12-30 MED ORDER — HYDROCHLOROTHIAZIDE 12.5 MG PO CAPS
12.5000 mg | ORAL_CAPSULE | Freq: Every day | ORAL | 3 refills | Status: DC
  Filled 2023-12-30: qty 90, 90d supply, fill #0
  Filled 2024-03-06: qty 90, 90d supply, fill #1

## 2023-12-31 ENCOUNTER — Other Ambulatory Visit: Payer: Self-pay

## 2024-01-08 ENCOUNTER — Other Ambulatory Visit (HOSPITAL_COMMUNITY): Payer: Self-pay

## 2024-01-08 DIAGNOSIS — R000 Tachycardia, unspecified: Secondary | ICD-10-CM | POA: Diagnosis not present

## 2024-01-09 ENCOUNTER — Other Ambulatory Visit (HOSPITAL_COMMUNITY): Payer: Self-pay

## 2024-01-09 ENCOUNTER — Other Ambulatory Visit: Payer: Self-pay

## 2024-01-13 DIAGNOSIS — R000 Tachycardia, unspecified: Secondary | ICD-10-CM | POA: Diagnosis not present

## 2024-01-16 ENCOUNTER — Other Ambulatory Visit (HOSPITAL_COMMUNITY): Payer: Self-pay

## 2024-01-16 MED ORDER — AMPHETAMINE-DEXTROAMPHETAMINE 30 MG PO TABS
15.0000 mg | ORAL_TABLET | Freq: Two times a day (BID) | ORAL | 0 refills | Status: DC
  Filled 2024-01-16 – 2024-01-23 (×3): qty 30, 30d supply, fill #0

## 2024-01-17 ENCOUNTER — Other Ambulatory Visit (HOSPITAL_COMMUNITY): Payer: Self-pay

## 2024-01-17 ENCOUNTER — Other Ambulatory Visit (HOSPITAL_BASED_OUTPATIENT_CLINIC_OR_DEPARTMENT_OTHER): Payer: Self-pay

## 2024-01-17 ENCOUNTER — Other Ambulatory Visit: Payer: Self-pay

## 2024-01-17 MED ORDER — NORETHIN ACE-ETH ESTRAD-FE 1-20 MG-MCG PO TABS
1.0000 | ORAL_TABLET | Freq: Every day | ORAL | 3 refills | Status: DC
  Filled 2024-01-17: qty 84, 84d supply, fill #0

## 2024-01-22 ENCOUNTER — Other Ambulatory Visit (HOSPITAL_COMMUNITY): Payer: Self-pay

## 2024-01-22 ENCOUNTER — Encounter (HOSPITAL_COMMUNITY): Payer: Self-pay

## 2024-01-23 ENCOUNTER — Other Ambulatory Visit (HOSPITAL_BASED_OUTPATIENT_CLINIC_OR_DEPARTMENT_OTHER): Payer: Self-pay

## 2024-01-23 ENCOUNTER — Other Ambulatory Visit (HOSPITAL_COMMUNITY): Payer: Self-pay

## 2024-01-23 ENCOUNTER — Other Ambulatory Visit: Payer: Self-pay

## 2024-01-28 ENCOUNTER — Other Ambulatory Visit (HOSPITAL_COMMUNITY): Payer: Self-pay

## 2024-02-13 ENCOUNTER — Other Ambulatory Visit (HOSPITAL_BASED_OUTPATIENT_CLINIC_OR_DEPARTMENT_OTHER): Payer: Self-pay

## 2024-02-13 MED ORDER — AMPHETAMINE-DEXTROAMPHETAMINE 30 MG PO TABS
15.0000 mg | ORAL_TABLET | Freq: Two times a day (BID) | ORAL | 0 refills | Status: DC
  Filled 2024-02-21 (×2): qty 30, 30d supply, fill #0

## 2024-02-20 ENCOUNTER — Ambulatory Visit (INDEPENDENT_AMBULATORY_CARE_PROVIDER_SITE_OTHER): Admitting: Family Medicine

## 2024-02-20 DIAGNOSIS — Z23 Encounter for immunization: Secondary | ICD-10-CM | POA: Diagnosis not present

## 2024-02-21 ENCOUNTER — Other Ambulatory Visit: Payer: Self-pay

## 2024-02-21 ENCOUNTER — Other Ambulatory Visit (HOSPITAL_BASED_OUTPATIENT_CLINIC_OR_DEPARTMENT_OTHER): Payer: Self-pay

## 2024-03-06 ENCOUNTER — Other Ambulatory Visit: Payer: Self-pay

## 2024-03-06 ENCOUNTER — Other Ambulatory Visit (HOSPITAL_COMMUNITY): Payer: Self-pay

## 2024-03-16 ENCOUNTER — Other Ambulatory Visit (HOSPITAL_BASED_OUTPATIENT_CLINIC_OR_DEPARTMENT_OTHER): Payer: Self-pay

## 2024-03-16 MED ORDER — AMPHETAMINE-DEXTROAMPHETAMINE 30 MG PO TABS
15.0000 mg | ORAL_TABLET | Freq: Two times a day (BID) | ORAL | 0 refills | Status: DC
  Filled 2024-03-20 (×2): qty 30, 30d supply, fill #0

## 2024-03-19 ENCOUNTER — Other Ambulatory Visit (HOSPITAL_BASED_OUTPATIENT_CLINIC_OR_DEPARTMENT_OTHER): Payer: Self-pay

## 2024-03-19 ENCOUNTER — Other Ambulatory Visit: Payer: Self-pay | Admitting: Family

## 2024-03-19 DIAGNOSIS — R110 Nausea: Secondary | ICD-10-CM

## 2024-03-19 MED ORDER — ONDANSETRON 4 MG PO TBDP
4.0000 mg | ORAL_TABLET | Freq: Three times a day (TID) | ORAL | 2 refills | Status: DC | PRN
  Filled 2024-03-19: qty 20, 7d supply, fill #0
  Filled 2024-05-08: qty 20, 7d supply, fill #1
  Filled 2024-07-01: qty 20, 7d supply, fill #2

## 2024-03-20 ENCOUNTER — Other Ambulatory Visit (HOSPITAL_COMMUNITY): Payer: Self-pay

## 2024-03-20 ENCOUNTER — Other Ambulatory Visit: Payer: Self-pay

## 2024-03-20 ENCOUNTER — Other Ambulatory Visit (HOSPITAL_BASED_OUTPATIENT_CLINIC_OR_DEPARTMENT_OTHER): Payer: Self-pay

## 2024-03-25 ENCOUNTER — Other Ambulatory Visit: Payer: Self-pay

## 2024-03-25 ENCOUNTER — Other Ambulatory Visit (HOSPITAL_COMMUNITY): Payer: Self-pay

## 2024-03-25 DIAGNOSIS — Z113 Encounter for screening for infections with a predominantly sexual mode of transmission: Secondary | ICD-10-CM | POA: Diagnosis not present

## 2024-03-25 DIAGNOSIS — Z124 Encounter for screening for malignant neoplasm of cervix: Secondary | ICD-10-CM | POA: Diagnosis not present

## 2024-03-25 DIAGNOSIS — Z01419 Encounter for gynecological examination (general) (routine) without abnormal findings: Secondary | ICD-10-CM | POA: Diagnosis not present

## 2024-03-25 DIAGNOSIS — Z01411 Encounter for gynecological examination (general) (routine) with abnormal findings: Secondary | ICD-10-CM | POA: Diagnosis not present

## 2024-03-25 MED ORDER — NORETHIN ACE-ETH ESTRAD-FE 1-20 MG-MCG PO TABS
1.0000 | ORAL_TABLET | Freq: Every day | ORAL | 3 refills | Status: AC
  Filled 2024-03-25: qty 84, 84d supply, fill #0
  Filled 2024-06-05: qty 28, 28d supply, fill #1
  Filled 2024-06-05: qty 84, 84d supply, fill #1
  Filled 2024-07-01: qty 84, 84d supply, fill #2
  Filled ????-??-??: fill #1

## 2024-04-09 ENCOUNTER — Other Ambulatory Visit: Payer: Self-pay | Admitting: Family

## 2024-04-09 MED ORDER — AZITHROMYCIN 250 MG PO TABS: ORAL_TABLET | ORAL | 0 refills | Status: DC

## 2024-04-09 NOTE — Progress Notes (Signed)
 Pt complaining of cough and URI symptoms on going. Zpak Prescription sent to pharmacy    Bari Learn, FNP

## 2024-04-15 DIAGNOSIS — I10 Essential (primary) hypertension: Secondary | ICD-10-CM | POA: Diagnosis not present

## 2024-04-21 ENCOUNTER — Other Ambulatory Visit: Payer: Self-pay | Admitting: Nurse Practitioner

## 2024-04-21 MED ORDER — PREDNISONE 10 MG (21) PO TBPK: ORAL_TABLET | ORAL | 0 refills | Status: DC

## 2024-04-22 ENCOUNTER — Encounter: Payer: Self-pay | Admitting: Nurse Practitioner

## 2024-04-22 MED ORDER — DOXYCYCLINE HYCLATE 100 MG PO TABS: 100.0000 mg | ORAL_TABLET | Freq: Two times a day (BID) | ORAL | 0 refills | Status: DC

## 2024-04-23 ENCOUNTER — Other Ambulatory Visit: Payer: Self-pay

## 2024-04-23 ENCOUNTER — Ambulatory Visit

## 2024-04-23 DIAGNOSIS — R053 Chronic cough: Secondary | ICD-10-CM

## 2024-04-29 ENCOUNTER — Encounter: Payer: Self-pay | Admitting: Nurse Practitioner

## 2024-04-29 ENCOUNTER — Ambulatory Visit: Payer: Self-pay | Admitting: Family Medicine

## 2024-04-29 MED ORDER — MOXIFLOXACIN HCL 400 MG PO TABS: 400.0000 mg | ORAL_TABLET | Freq: Every day | ORAL | 0 refills | Status: AC

## 2024-05-08 ENCOUNTER — Other Ambulatory Visit (HOSPITAL_COMMUNITY): Payer: Self-pay

## 2024-05-09 ENCOUNTER — Other Ambulatory Visit (HOSPITAL_COMMUNITY): Payer: Self-pay

## 2024-05-12 ENCOUNTER — Other Ambulatory Visit: Payer: Self-pay

## 2024-06-02 ENCOUNTER — Other Ambulatory Visit: Payer: Self-pay | Admitting: Nurse Practitioner

## 2024-06-02 MED ORDER — VALACYCLOVIR HCL 1 G PO TABS: ORAL_TABLET | ORAL | 1 refills | Status: DC

## 2024-06-05 ENCOUNTER — Other Ambulatory Visit: Payer: Self-pay

## 2024-06-05 ENCOUNTER — Other Ambulatory Visit (HOSPITAL_COMMUNITY): Payer: Self-pay

## 2024-07-01 ENCOUNTER — Other Ambulatory Visit (HOSPITAL_COMMUNITY): Payer: Self-pay

## 2024-07-07 ENCOUNTER — Encounter: Payer: Self-pay | Admitting: Family

## 2024-07-07 ENCOUNTER — Ambulatory Visit: Admitting: Family

## 2024-07-07 ENCOUNTER — Other Ambulatory Visit (HOSPITAL_BASED_OUTPATIENT_CLINIC_OR_DEPARTMENT_OTHER): Payer: Self-pay

## 2024-07-07 VITALS — BP 146/85 | HR 91 | Temp 97.0°F | Ht 67.0 in | Wt 147.0 lb

## 2024-07-07 DIAGNOSIS — R110 Nausea: Secondary | ICD-10-CM | POA: Diagnosis not present

## 2024-07-07 DIAGNOSIS — F902 Attention-deficit hyperactivity disorder, combined type: Secondary | ICD-10-CM

## 2024-07-07 DIAGNOSIS — B002 Herpesviral gingivostomatitis and pharyngotonsillitis: Secondary | ICD-10-CM

## 2024-07-07 DIAGNOSIS — K219 Gastro-esophageal reflux disease without esophagitis: Secondary | ICD-10-CM

## 2024-07-07 DIAGNOSIS — I10 Essential (primary) hypertension: Secondary | ICD-10-CM

## 2024-07-07 DIAGNOSIS — Z713 Dietary counseling and surveillance: Secondary | ICD-10-CM | POA: Diagnosis not present

## 2024-07-07 MED ORDER — PANTOPRAZOLE SODIUM 20 MG PO TBEC: 40.0000 mg | DELAYED_RELEASE_TABLET | Freq: Every day | ORAL | 4 refills | Status: AC

## 2024-07-07 MED ORDER — LISDEXAMFETAMINE DIMESYLATE 20 MG PO CAPS: 20.0000 mg | ORAL_CAPSULE | Freq: Every day | ORAL | 0 refills | Status: DC

## 2024-07-07 MED ORDER — HYDROCHLOROTHIAZIDE 25 MG PO TABS: 25.0000 mg | ORAL_TABLET | Freq: Every day | ORAL | 3 refills | Status: AC

## 2024-07-07 MED ORDER — LISDEXAMFETAMINE DIMESYLATE 20 MG PO CAPS
20.0000 mg | ORAL_CAPSULE | Freq: Every day | ORAL | 0 refills | Status: AC
  Filled 2024-07-07: qty 30, 30d supply, fill #0

## 2024-07-07 MED ORDER — LISDEXAMFETAMINE DIMESYLATE 20 MG PO CAPS: 20.0000 mg | ORAL_CAPSULE | Freq: Every day | ORAL | 0 refills | Status: AC

## 2024-07-07 MED ORDER — VALACYCLOVIR HCL 1 G PO TABS: ORAL_TABLET | ORAL | 1 refills | Status: AC

## 2024-07-07 MED ORDER — ONDANSETRON 4 MG PO TBDP: 4.0000 mg | ORAL_TABLET | Freq: Three times a day (TID) | ORAL | 2 refills | Status: AC | PRN

## 2024-07-07 MED ORDER — WEGOVY 0.5 MG/0.5ML ~~LOC~~ SOAJ: 0.5000 mg | SUBCUTANEOUS | 2 refills | Status: AC

## 2024-07-07 NOTE — Patient Instructions (Signed)
 Hypertension, Adult Hypertension is another name for high blood pressure. High blood pressure forces your heart to work harder to pump blood. This can cause problems over time. There are two numbers in a blood pressure reading. There is a top number (systolic) over a bottom number (diastolic). It is best to have a blood pressure that is below 120/80. What are the causes? The cause of this condition is not known. Some other conditions can lead to high blood pressure. What increases the risk? Some lifestyle factors can make you more likely to develop high blood pressure: Smoking. Not getting enough exercise or physical activity. Being overweight. Having too much fat, sugar, calories, or salt (sodium) in your diet. Drinking too much alcohol. Other risk factors include: Having any of these conditions: Heart disease. Diabetes. High cholesterol. Kidney disease. Obstructive sleep apnea. Having a family history of high blood pressure and high cholesterol. Age. The risk increases with age. Stress. What are the signs or symptoms? High blood pressure may not cause symptoms. Very high blood pressure (hypertensive crisis) may cause: Headache. Fast or uneven heartbeats (palpitations). Shortness of breath. Nosebleed. Vomiting or feeling like you may vomit (nauseous). Changes in how you see. Very bad chest pain. Feeling dizzy. Seizures. How is this treated? This condition is treated by making healthy lifestyle changes, such as: Eating healthy foods. Exercising more. Drinking less alcohol. Your doctor may prescribe medicine if lifestyle changes do not help enough and if: Your top number is above 130. Your bottom number is above 80. Your personal target blood pressure may vary. Follow these instructions at home: Eating and drinking  If told, follow the DASH eating plan. To follow this plan: Fill one half of your plate at each meal with fruits and vegetables. Fill one fourth of your plate  at each meal with whole grains. Whole grains include whole-wheat pasta, brown rice, and whole-grain bread. Eat or drink low-fat dairy products, such as skim milk or low-fat yogurt. Fill one fourth of your plate at each meal with low-fat (lean) proteins. Low-fat proteins include fish, chicken without skin, eggs, beans, and tofu. Avoid fatty meat, cured and processed meat, or chicken with skin. Avoid pre-made or processed food. Limit the amount of salt in your diet to less than 1,500 mg each day. Do not drink alcohol if: Your doctor tells you not to drink. You are pregnant, may be pregnant, or are planning to become pregnant. If you drink alcohol: Limit how much you have to: 0-1 drink a day for women. 0-2 drinks a day for men. Know how much alcohol is in your drink. In the [place] one drink equals one 12 oz bottle of beer (355 mL), one 5 oz glass of wine (148 mL), or one 1 oz glass of hard liquor (44 mL). Lifestyle  Work with your doctor to stay at a healthy weight or to lose weight. Ask your doctor what the best weight is for you. Get at least 30 minutes of exercise that causes your heart to beat faster (aerobic exercise) most days of the week. This may include walking, swimming, or biking. Get at least 30 minutes of exercise that strengthens your muscles (resistance exercise) at least 3 days a week. This may include lifting weights or doing Pilates. Do not smoke or use any products that contain nicotine or tobacco. If you need help quitting, ask your doctor. Check your blood pressure at home as told by your doctor. Keep all follow-up visits. Medicines Take over-the-counter and prescription medicines  only as told by your doctor. Follow directions carefully. Do not skip doses of blood pressure medicine. The medicine does not work as well if you skip doses. Skipping doses also puts you at risk for problems. Ask your doctor about side effects or reactions to medicines that you should watch  for. Contact a doctor if: You think you are having a reaction to the medicine you are taking. You have headaches that keep coming back. You feel dizzy. You have swelling in your ankles. You have trouble with your vision. Get help right away if: You get a very bad headache. You start to feel mixed up (confused). You feel weak or numb. You feel faint. You have very bad pain in your: Chest. Belly (abdomen). You vomit more than once. You have trouble breathing. These symptoms may be an emergency. Get help right away. Call 911. Do not wait to see if the symptoms will go away. Do not drive yourself to the hospital. Summary Hypertension is another name for high blood pressure. High blood pressure forces your heart to work harder to pump blood. For most people, a normal blood pressure is less than 120/80. Making healthy choices can help lower blood pressure. If your blood pressure does not get lower with healthy choices, you may need to take medicine. This information is not intended to replace advice given to you by your health care provider. Make sure you discuss any questions you have with your health care provider. Document Revised: 03/09/2021 Document Reviewed: 03/09/2021 Elsevier Patient Education  2024 [employer].

## 2024-07-07 NOTE — Progress Notes (Signed)
 "  Subjective:    Patient ID: Christina Golden, female    DOB: 12/06/1985, 39 y.o.   MRN: [id number]  Chief Complaint  Patient presents with   New Patient (Initial Visit)   Pt presents to the office today to reestablish.   She is followed by GYN annually.   She has oral herpes that comes and goes. She takes valtrex  as needed for this.   She has ADHD and currently taking Adderall 15 mg BID. Has a hard time focusing and completing tasks. This dose is not helping her any longer. She reports having 30+ charts open.   She is on a maintenance dose of Wegovy  0.5 mg. Hypertension This is a chronic problem. The current episode started more than 1 month ago. The problem has been waxing and waning since onset. The problem is uncontrolled. Associated symptoms include malaise/fatigue. Pertinent negatives include no peripheral edema or shortness of breath. Past treatments include diuretics. The current treatment provides mild improvement.  Gastroesophageal Reflux She complains of belching and heartburn. This is a chronic problem. The current episode started more than 1 year ago. The problem occurs occasionally. The symptoms are aggravated by certain foods. She has tried a PPI for the symptoms. The treatment provided moderate relief.      Review of Systems  Constitutional:  Positive for malaise/fatigue.  Respiratory:  Negative for shortness of breath.   Gastrointestinal:  Positive for heartburn.  All other systems reviewed and are negative.   Social History   Socioeconomic History   Marital status: Married    Spouse name: Not on file   Number of children: Not on file   Years of education: Not on file   Highest education level: Not on file  Occupational History   Not on file  Tobacco Use   Smoking status: Never   Smokeless tobacco: Never  Vaping Use   Vaping status: Never Used  Substance and Sexual Activity   Alcohol use: Not Currently    Comment: Social   Drug use: No   Sexual  activity: Yes    Birth control/protection: None  Other Topics Concern   Not on file  Social History Narrative   Not on file   Social Drivers of Health   Tobacco Use: Low Risk (07/07/2024)   Patient History    Smoking Tobacco Use: Never    Smokeless Tobacco Use: Never    Passive Exposure: Not on file  Financial Resource Strain: Not on file  Food Insecurity: Low Risk (04/15/2024)   Received from [facility]   Epic    Within the past 12 months, the food you bought just didn't last and you didn't have money to get more. : Never true    Within the past 12 months, you worried that your food would run out before you got money to buy more: Never true  Transportation Needs: No Transportation Needs (12/17/2023)   Received from [facility]    In the past 12 months, has lack of reliable transportation kept you from medical appointments, meetings, work or from getting things needed for daily living? : No  Physical Activity: Not on file  Stress: Not on file  Social Connections: Not on file  Depression (EYV7-0): Not on file  Alcohol Screen: Not on file  Housing: Low Risk (04/15/2024)   Received from [facility]   Epic    What is your living situation today?: I have a steady place to live    Think  about the place you live. Do you have problems with any of the following? Choose all that apply:: None/None on this list  Utilities: Low Risk (12/17/2023)   Received from [facility]   Utilities    In the past 12 months has the electric, gas, oil, or water company threatened to shut off services in your home? : No  Health Literacy: Not on file   Family History  Problem Relation Age of Onset   Hypertension Mother    Hypertension Father    Hyperlipidemia Father    Healthy Sister    Healthy Brother         Objective:   Physical Exam Vitals reviewed.  Constitutional:      General: She is not in acute distress.    Appearance: She is well-developed.  HENT:     Head:  Normocephalic and atraumatic.     Right Ear: Tympanic membrane normal.     Left Ear: Tympanic membrane normal.  Eyes:     Pupils: Pupils are equal, round, and reactive to light.  Neck:     Thyroid : No thyromegaly.  Cardiovascular:     Rate and Rhythm: Normal rate and regular rhythm.     Heart sounds: Normal heart sounds. No murmur heard. Pulmonary:     Effort: Pulmonary effort is normal. No respiratory distress.     Breath sounds: Normal breath sounds. No wheezing.  Abdominal:     General: Bowel sounds are normal. There is no distension.     Palpations: Abdomen is soft.     Tenderness: There is no abdominal tenderness.  Musculoskeletal:        General: No tenderness. Normal range of motion.     Cervical back: Normal range of motion and neck supple.  Skin:    General: Skin is warm and dry.  Neurological:     Mental Status: She is alert and oriented to person, place, and time.     Cranial Nerves: No cranial nerve deficit.     Deep Tendon Reflexes: Reflexes are normal and symmetric.  Psychiatric:        Behavior: Behavior normal.        Thought Content: Thought content normal.        Judgment: Judgment normal.      BP (!) 146/85   Pulse 91   Temp (!) 97 F (36.1 C) (Temporal)   Ht 5' 7 (1.702 m)   Wt 147 lb (66.7 kg)   SpO2 98%   BMI 23.02 kg/m      Assessment & Plan:  Christina Golden comes in today with chief complaint of New Patient (Initial Visit)   Diagnosis and orders addressed:  1. Nausea - ondansetron  (ZOFRAN -ODT) 4 MG disintegrating tablet; Dissolve 1 tablet (4 mg total) by mouth every 8 (eight) hours as needed for nausea or vomiting.  Dispense: 20 tablet; Refill: 2 - CMP14+EGFR; Future  2. Gastroesophageal reflux disease without esophagitis (Primary) - pantoprazole  (PROTONIX ) 20 MG tablet; Take 2 tablets (40 mg total) by mouth daily.  Dispense: 90 tablet; Refill: 4 - CMP14+EGFR; Future  3. Primary hypertension Will increase hydrochlorothiazide  to 25  mg from 12.5 mg -Daily blood pressure log given with instructions on how to fill out and told to bring to next visit -Dash diet information given -Exercise encouraged - Stress Management  -Continue current meds - hydrochlorothiazide  (HYDRODIURIL ) 25 MG tablet; Take 1 tablet (25 mg total) by mouth daily.  Dispense: 90 tablet; Refill: 3 - CMP14+EGFR; Future  4. Weight loss counseling, encounter for - semaglutide -weight management (WEGOVY ) 0.5 MG/0.5ML SOAJ SQ injection; Inject 0.5 mg into the skin every 30 (thirty) days.  Dispense: 2 mL; Refill: 2 - CMP14+EGFR; Future  5. Attention deficit hyperactivity disorder (ADHD), combined type Will start Vyvanse  20 mg today Meds as prescribed Behavior modification as needed Follow-up for recheck in 3 months - lisdexamfetamine  (VYVANSE ) 20 MG capsule; Take 1 capsule (20 mg total) by mouth daily before breakfast.  Dispense: 30 capsule; Refill: 0 - lisdexamfetamine  (VYVANSE ) 20 MG capsule; Take 1 capsule (20 mg total) by mouth daily before breakfast.  Dispense: 30 capsule; Refill: 0 - lisdexamfetamine  (VYVANSE ) 20 MG capsule; Take 1 capsule (20 mg total) by mouth daily before breakfast.  Dispense: 30 capsule; Refill: 0 - CMP14+EGFR; Future  6. Oral herpes - valACYclovir  (VALTREX ) 1000 MG tablet; 2 po in morning and 2po at night for 1 day at fever blister onset  Dispense: 20 tablet; Refill: 1 - CMP14+EGFR; Future   Labs pending Patient reviewed in [place] controlled database, no flags noted. Contract and drug screen are up to date.  Vyvanase 20 mg daily Will increase hydrochlorothiazide  to 25 mg from 12.5 mg -Daily blood pressure log given with instructions on how to fill out and told to bring to next visit -Dash diet information given -Exercise encouraged - Stress Managements Keep follow up with specialists  Health Maintenance reviewed Diet and exercise encouraged  Return in about 3 months (around 10/04/2024), or if symptoms worsen or fail to  improve.    Bari Learn, FNP   "
